# Patient Record
Sex: Male | Born: 1950 | Race: White | Hispanic: No | Smoking: Former smoker
Health system: Southern US, Community
[De-identification: ages and names within clinical notes are randomized; demographics above are authoritative.]

## PROBLEM LIST (undated history)

## (undated) DIAGNOSIS — J189 Pneumonia, unspecified organism: Secondary | ICD-10-CM

## (undated) DIAGNOSIS — K219 Gastro-esophageal reflux disease without esophagitis: Secondary | ICD-10-CM

## (undated) DIAGNOSIS — R011 Cardiac murmur, unspecified: Secondary | ICD-10-CM

## (undated) DIAGNOSIS — G4730 Sleep apnea, unspecified: Secondary | ICD-10-CM

## (undated) DIAGNOSIS — J479 Bronchiectasis, uncomplicated: Secondary | ICD-10-CM

## (undated) DIAGNOSIS — J45909 Unspecified asthma, uncomplicated: Secondary | ICD-10-CM

## (undated) DIAGNOSIS — E119 Type 2 diabetes mellitus without complications: Secondary | ICD-10-CM

## (undated) HISTORY — DX: Sleep apnea, unspecified: G47.30

## (undated) HISTORY — DX: Cardiac murmur, unspecified: R01.1

## (undated) HISTORY — DX: Bronchiectasis, uncomplicated: J47.9

## (undated) HISTORY — DX: Type 2 diabetes mellitus without complications: E11.9

## (undated) HISTORY — DX: Unspecified asthma, uncomplicated: J45.909

## (undated) HISTORY — DX: Pneumonia, unspecified organism: J18.9

## (undated) HISTORY — PX: ELBOW SURGERY: SHX618

## (undated) HISTORY — DX: Gastro-esophageal reflux disease without esophagitis: K21.9

---

## 1999-07-26 ENCOUNTER — Ambulatory Visit (HOSPITAL_BASED_OUTPATIENT_CLINIC_OR_DEPARTMENT_OTHER): Admission: RE | Admit: 1999-07-26 | Discharge: 1999-07-26 | Payer: Self-pay | Admitting: Orthopedic Surgery

## 1999-08-09 ENCOUNTER — Ambulatory Visit (HOSPITAL_BASED_OUTPATIENT_CLINIC_OR_DEPARTMENT_OTHER): Admission: RE | Admit: 1999-08-09 | Discharge: 1999-08-09 | Payer: Self-pay | Admitting: Orthopedic Surgery

## 2000-10-09 HISTORY — PX: SPINAL FUSION: SHX223

## 2000-12-21 ENCOUNTER — Ambulatory Visit (HOSPITAL_BASED_OUTPATIENT_CLINIC_OR_DEPARTMENT_OTHER): Admission: RE | Admit: 2000-12-21 | Discharge: 2000-12-21 | Payer: Self-pay | Admitting: Orthopedic Surgery

## 2001-11-12 ENCOUNTER — Encounter: Admission: RE | Admit: 2001-11-12 | Discharge: 2001-11-12 | Payer: Self-pay | Admitting: Specialist

## 2001-11-12 ENCOUNTER — Encounter: Payer: Self-pay | Admitting: Specialist

## 2002-01-08 ENCOUNTER — Encounter: Payer: Self-pay | Admitting: Specialist

## 2002-01-08 ENCOUNTER — Inpatient Hospital Stay (HOSPITAL_COMMUNITY): Admission: RE | Admit: 2002-01-08 | Discharge: 2002-01-13 | Payer: Self-pay | Admitting: Specialist

## 2002-01-10 ENCOUNTER — Encounter: Payer: Self-pay | Admitting: Specialist

## 2002-01-11 ENCOUNTER — Encounter: Payer: Self-pay | Admitting: Specialist

## 2002-10-09 HISTORY — PX: WRIST SURGERY: SHX841

## 2003-01-29 ENCOUNTER — Encounter
Admission: RE | Admit: 2003-01-29 | Discharge: 2003-04-29 | Payer: Self-pay | Admitting: Physical Medicine & Rehabilitation

## 2003-05-28 ENCOUNTER — Encounter
Admission: RE | Admit: 2003-05-28 | Discharge: 2003-08-26 | Payer: Self-pay | Admitting: Physical Medicine & Rehabilitation

## 2003-08-27 ENCOUNTER — Encounter
Admission: RE | Admit: 2003-08-27 | Discharge: 2003-11-25 | Payer: Self-pay | Admitting: Physical Medicine & Rehabilitation

## 2003-11-03 ENCOUNTER — Emergency Department (HOSPITAL_COMMUNITY): Admission: EM | Admit: 2003-11-03 | Discharge: 2003-11-03 | Payer: Self-pay | Admitting: *Deleted

## 2004-01-07 ENCOUNTER — Encounter
Admission: RE | Admit: 2004-01-07 | Discharge: 2004-04-06 | Payer: Self-pay | Admitting: Physical Medicine & Rehabilitation

## 2004-04-07 ENCOUNTER — Encounter
Admission: RE | Admit: 2004-04-07 | Discharge: 2004-06-30 | Payer: Self-pay | Admitting: Physical Medicine & Rehabilitation

## 2004-06-30 ENCOUNTER — Encounter
Admission: RE | Admit: 2004-06-30 | Discharge: 2004-09-28 | Payer: Self-pay | Admitting: Physical Medicine & Rehabilitation

## 2004-07-01 ENCOUNTER — Ambulatory Visit: Payer: Self-pay | Admitting: Physical Medicine & Rehabilitation

## 2005-12-14 ENCOUNTER — Ambulatory Visit: Payer: Self-pay | Admitting: Physical Medicine & Rehabilitation

## 2005-12-14 ENCOUNTER — Encounter
Admission: RE | Admit: 2005-12-14 | Discharge: 2006-03-14 | Payer: Self-pay | Admitting: Physical Medicine & Rehabilitation

## 2006-02-08 ENCOUNTER — Ambulatory Visit: Payer: Self-pay | Admitting: Physical Medicine & Rehabilitation

## 2006-05-17 ENCOUNTER — Encounter
Admission: RE | Admit: 2006-05-17 | Discharge: 2006-08-15 | Payer: Self-pay | Admitting: Physical Medicine & Rehabilitation

## 2006-05-21 ENCOUNTER — Ambulatory Visit: Payer: Self-pay | Admitting: Physical Medicine & Rehabilitation

## 2006-07-19 ENCOUNTER — Ambulatory Visit: Payer: Self-pay | Admitting: Physical Medicine & Rehabilitation

## 2006-09-07 ENCOUNTER — Ambulatory Visit: Payer: Self-pay | Admitting: Physical Medicine & Rehabilitation

## 2006-09-07 ENCOUNTER — Encounter
Admission: RE | Admit: 2006-09-07 | Discharge: 2006-12-06 | Payer: Self-pay | Admitting: Physical Medicine & Rehabilitation

## 2006-11-26 ENCOUNTER — Encounter
Admission: RE | Admit: 2006-11-26 | Discharge: 2007-02-24 | Payer: Self-pay | Admitting: Physical Medicine & Rehabilitation

## 2006-11-27 ENCOUNTER — Ambulatory Visit: Payer: Self-pay | Admitting: Physical Medicine & Rehabilitation

## 2007-02-14 ENCOUNTER — Ambulatory Visit: Payer: Self-pay | Admitting: Physical Medicine & Rehabilitation

## 2007-04-04 ENCOUNTER — Encounter
Admission: RE | Admit: 2007-04-04 | Discharge: 2007-07-03 | Payer: Self-pay | Admitting: Physical Medicine & Rehabilitation

## 2007-04-09 ENCOUNTER — Ambulatory Visit: Payer: Self-pay | Admitting: Physical Medicine & Rehabilitation

## 2007-05-27 ENCOUNTER — Ambulatory Visit: Payer: Self-pay | Admitting: Physical Medicine & Rehabilitation

## 2007-08-15 ENCOUNTER — Ambulatory Visit: Payer: Self-pay | Admitting: Physical Medicine & Rehabilitation

## 2007-08-15 ENCOUNTER — Encounter
Admission: RE | Admit: 2007-08-15 | Discharge: 2007-11-13 | Payer: Self-pay | Admitting: Physical Medicine & Rehabilitation

## 2007-09-30 ENCOUNTER — Ambulatory Visit: Payer: Self-pay | Admitting: Physical Medicine & Rehabilitation

## 2007-10-21 ENCOUNTER — Emergency Department (HOSPITAL_COMMUNITY): Admission: EM | Admit: 2007-10-21 | Discharge: 2007-10-21 | Payer: Self-pay | Admitting: Family Medicine

## 2007-10-28 ENCOUNTER — Encounter
Admission: RE | Admit: 2007-10-28 | Discharge: 2008-01-26 | Payer: Self-pay | Admitting: Physical Medicine & Rehabilitation

## 2007-11-03 ENCOUNTER — Encounter
Admission: RE | Admit: 2007-11-03 | Discharge: 2007-11-03 | Payer: Self-pay | Admitting: Physical Medicine & Rehabilitation

## 2007-11-11 ENCOUNTER — Ambulatory Visit: Payer: Self-pay | Admitting: Physical Medicine & Rehabilitation

## 2007-11-26 ENCOUNTER — Emergency Department (HOSPITAL_COMMUNITY): Admission: EM | Admit: 2007-11-26 | Discharge: 2007-11-26 | Payer: Self-pay | Admitting: Family Medicine

## 2007-12-18 ENCOUNTER — Encounter
Admission: RE | Admit: 2007-12-18 | Discharge: 2007-12-19 | Payer: Self-pay | Admitting: Physical Medicine & Rehabilitation

## 2007-12-18 ENCOUNTER — Ambulatory Visit: Payer: Self-pay | Admitting: Physical Medicine & Rehabilitation

## 2011-02-21 NOTE — Procedures (Signed)
NAME:  Jonathon Cooper, Jonathon Cooper NO.:  [id number]   MEDICAL RECORD NO.:  [id number]           PATIENT TYPE:   LOCATION:                                 FACILITY:   PHYSICIAN:  [name], M.D.DATE OF BIRTH:  06-04-1951   DATE OF PROCEDURE:  DATE OF DISCHARGE:                               OPERATIVE REPORT   PROCEDURE:  This is right L3-4 transforaminal lumbar epidural steroid  injection under fluoroscopic guidance.   INDICATIONS:  Lumbar radiculopathy with MRI evidence of L3-4 foraminal  disk spacing exiting right L3 nerve root.   Pain is only partially responsive to medication management including  narcotic analgesics and interferes with daily activities as well as  mobility.   Informed consent was obtained after describing risks and benefits of the  procedure to the patient.  These include bleeding, bruising, infection,  loss of bowel and bladder function, temporary or permanent paralysis.  He elects to proceed and has given written consent.   The patient was placed prone on fluoroscopy table.  Betadine prep,  sterile drape.   A  25-gauge inch and half needle used to incise skin and subcutaneous  tissue  and 1% lidocaine x2 mL at a 3 to 4 week repeat.  Then a 22-  gauge,  1/2-inch spinal needle was inserted under fluoroscopic guidance.  L3-4 intervertebral foramen AP lateral and oblique imaging utilized.  Omnipaque 180 x 1 mL demonstrated good epidural spread followed by  injection of 1 mL of 10 mg/mL dexamethasone and 2 mL of 1% MPF  lidocaine.  The patient tolerated the procedure well.  Pre and post  injection, vital stable.  If the above is not particularly helpful in  terms of reducing pain, may need to try translaminar approach.      [name], M.D.  Electronically Signed     AEK/MEDQ  D:  11/25/2007 11:22:09  T:  11/26/2007 11:02:07  Job:  [id number]

## 2011-02-21 NOTE — Procedures (Signed)
NAME:  ELGIN, CARN NO.:  [id number]   MEDICAL RECORD NO.:  [id number]          PATIENT TYPE:  REC   LOCATION:  [facility]                          FACILITY:  [facility]   PHYSICIAN:  [name], M.D.DATE OF BIRTH:  1951/08/04   DATE OF PROCEDURE:  04/30/2007  DATE OF DISCHARGE:                               OPERATIVE REPORT   PROCEDURE:  Trigger point injection for thoracolumbar myofascial pain.   The painful area was marked and prepped 3 cm lateral to the spinous  processes of T3, T5, T7, marked with Betadine and entered with a 25-  gauge inch and a half needle angled medially.  Lidocaine 1% 0.75 mL  injected into each side after negative drawback for blood.  The patient  tolerated the procedure well.  Post injection instructions given.      [name], M.D.  Electronically Signed     AEK/MEDQ  D:  04/30/2007 09:25:51  T:  04/30/2007 84:69:62  Job:  [id number]

## 2011-02-21 NOTE — Assessment & Plan Note (Signed)
DATE OF LAST VISIT:  DATE TODAY 04/09/07  Feb 14, 2007.   HISTORY:  This is a 60 year old male whom I have been following for  lumbar laminectomy syndrome.  He has had some problems with increasing  pain above the level of his surgery. However, he has also had pain in  the mid thoracic level around T4-T7.  Which has responded to trigger  point injections.  Since I last saw him on Feb 14, 2007; he has had an  exacerbation of his pain due to two events.  Two and one-half weeks ago  he fell down some steps, at his home.  He was going down some carpeted  steps with socks; and bumped down the last several steps on his  buttocks.  In addition, one week ago he was riding his riding mower when  he hit a large hole which stopped the mower, and threw him up in the  air; and, once again, he landed hard on his buttocks.  He denies any  bowel or bladder dysfunction.  He denies any lower extremity weakness.  He has no numbness, other than his usual left foot numbness that he has  had chronically since his surgery.  He has calf pain bilaterally since  the falls.   He has an appointment with Dr. Jene Every, next week, at [facility].  That was his surgeon for his laminectomy.   REGULAR MEDICATIONS INCLUDE:  1. Tramadol 2 tabs p.o. q.i.d.  2. Ativan 0.5 nightly.  3. Trazodone 50 mg nightly.  4. Cyclobenzaprine 5 to 10 mg t.i.d. p.r.n.   His pain level, currently, is 8/10.  Sleep is fair.  He can walk 60  minutes and climbs steps.  He continues to work 40 hours a week.  He has  had some anxiety as well as increased pain. His blood pressure and pulse  have been running higher which he attributes to his pain.   SOCIAL HISTORY:  Married.   PHYSICAL EXAMINATION:  VITAL SIGNS:  Blood pressure 155/90, pulse 108,  respiratory rate 20.  He is saturating 96% on room air.  GENERAL:  A well-developed well-nourished male in mild distress.  MUSCULOSKELETAL:  He moves rather stiffly when arising  from a chair or  the exam table.  He is able to walk without evidence of toe drag or knee  instability.  His lower extremity strength is normal.  His deep tendon  reflexes are normal.  His straight leg raising causes pain in the  hamstring on the left and pain in the right calf.  There is no evidence  of muscle atrophy.  BACK:  He has tenderness in the mid thoracic area, the paraspinals, but  not at the spinous processes.  Also he has some lower pain around the  surgical incision.   IMPRESSION:  Exacerbation of low back pain.  This appears to be mainly  axial with some lower extremity radiation; however, no signs of a  significant radiculopathy or cauda equina type syndrome.   RECOMMENDATIONS:  Given the acute nature of his pain, we will supplement  his other pain medicines with hydrocodone 5/500 one p.o. t.i.d. given  #30 (a 10-day supply).  That should cover him until he gets in with Dr.  Shelle Iron for x-rays and further evaluation.  I will see him back in about  two weeks.  I do think a component of his pain is also related to his  myofascial pain in his thoracic area; and I  will schedule him for  trigger points.      [name], M.D.  Electronically Signed     AEK/MedQ  D:  04/09/2007 13:22:55  T:  04/09/2007 16:33:37  Job #:  [id number]   cc:   Jene Every, M.D.  Fax: [phone]

## 2011-02-21 NOTE — Assessment & Plan Note (Signed)
The patient returns today.  He was last seen by me for a sacroiliac  injection on September 09, 2007.  He had done quite well with this and in  fact the pain in the lower back and buttock area has improved.  His main  complaint is his mid back pain.  He has had no new medical problems in  the interval of time.  He can walk 60 minutes at a time.  He continues  to do some yard work such as Roto-tilling.  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   PHYSICAL EXAMINATION:  GENERAL:  No acute distress.  Mood and affect  appropriate.  VITAL SIGNS:  Blood pressure 169/81, pulse 89, respiratory rate 20, O2  saturation 96% on room air.  BACK:  Tenderness intrascapular area right over the spinous process  around T5 area.  EXTREMITIES:  He has good lower extremity strength.  His low back has no  pain over the PSIS area.  His hip range of motion is good.  Deep tendon  reflexes are normal.  Upper extremity strength is normal.  Upper  extremity range of motion is normal.   IMPRESSION:  1. Sacroiliac pain status post L4-5 fusion has had some improvement      with injection.  2. Chronic thoracic pain.  He has had myofascial pain in that area,      although, today appears to be more axial.   PLAN:  Continue current medications which is hydrocodone 5/500 b.i.d. as  well as Tramadol two p.o. q.i.d., Ativan 0.5 q.h.s., Flexeril 10 one  half to one p.o. t.i.d., and Lidoderm patch up to three patches per day  as well as Trazodone 50 mg q.h.s.   I will see him back in one month.      [name], M.D.  Electronically Signed     AEK/MedQ  D:  09/30/2007 15:47:16  T:  09/30/2007 22:13:03  Job #:  [id number]

## 2011-02-21 NOTE — Assessment & Plan Note (Signed)
[name] returns today. He was last seen by me on 06/21/2007 at which  time we did a follow up visit. He has a history of L4-5 fusion with  lumbar post-laminectomy syndrome. He had a left sacral ileac arthropathy  improved after sacral ileac injection x2 which was done on the left side  back in August and also in July. He had good relief with that, however,  he has had some recurrence now over the last several weeks.   No new back injuries. His pain in the mid-back area is fairly well  controlled on the current medications.   MEDICATIONS:  1. Tramadol 2 q.i.d.  2. Flexeril 10 mg t.i.d.   He had been on hydrocodone 5/500 mg 1 daily. These upset his stomach  somewhat, but they did help. He did not take the Tramadol when he took  the hydrocodone.   PHYSICAL EXAMINATION:  He has some numbness in the L5 distribution on  the left side. He has PSIS tenderness on the right, greater than the  left side. His lumbar range of motion is approximately 50% forward  flexion and extension. His lower extremity range of motion is normal at  the hips, knees, and ankles. Deep tendon reflexes are normal.   He has no tenderness to palpation over the thoracic paraspinals.   IMPRESSION:  1. 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  2. Thoracal lumbar myofascial pain that is controlled.   PLAN:  1. Continue Tramadol 50 mg 2 p.o. q.i.d.  2. Continue Sinemet 25/100 mg nightly.  3. Discontinue cyclobenzaprine 1 p.o. t.i.d.  4. Reinstitute hydrocodone 5/500 mg, but give 1/2 tablet p.o. b.i.d.      p.r.n.   I will see him back for bilateral sacral ileac injections given his  increased pain in that area, as well as some pain going down the  posterior thigh, which does not appear to be radicular, but related to  radiating pain from the sacral ileac joints.      [name], M.D.  Electronically Signed     AEK/MedQ  D:  08/16/2007 13:58:03  T:   08/17/2007 01:32:12  Job #:  [id number]

## 2011-02-21 NOTE — Procedures (Signed)
NAME:  Jonathon Cooper, Jonathon Cooper NO.:  [id number]   MEDICAL RECORD NO.:  [id number]          PATIENT TYPE:  REC   LOCATION:  [facility]                          FACILITY:  [facility]   PHYSICIAN:  [name], M.D.DATE OF BIRTH:  10-02-1951   DATE OF PROCEDURE:  12/19/2007  DATE OF DISCHARGE:                               OPERATIVE REPORT   Right L3-4 translaminar lumbar epidural steroid injection under  fluoroscopic guidance.   INDICATIONS:  Lumbar radiculitis right lower extremity.  He has had only  partial and temporary relief with medication management and with L3-4  transforaminal approach.   Pain is rated at 6/10 but averages 8 and interferes with standing and  bending and sitting.   Informed consent was obtained after describing risks and benefits of the  procedure with the patient.  These include bleeding, bruising,  infection, loss of bowel/bladder function, temporary or permanent  paralysis, and he elects to proceed and has given written consent.  The  patient placed prone on fluoroscopy table.  Betadine prep, sterile  drape, 25-gauge 1-1/2 inch needle was used to anesthetize skin and subcu  tissue 1% lidocaine x2 mL.  Then the 18 gauge 3-1/2 inch Hustead needle  was inserted under fluoroscopic guidance, L3-4 translaminar space,  targeting the inferior lamina of L3, bone contact made and needle  redirected inferiorly, medially and under lateral imaging, loss of  resistance technique was employed.  After positive loss of resistance,  Omnipaque 180 x 1 mL demonstrated good epidural spread followed by  injection 2 mL of 40 mg/mL Depo-Medrol and 2 mL of 1% preservative-free  lidocaine.  The patient tolerated the procedure well.  Pre and post  injection vitals stable.  Postinjection instructions given.   Return in 1 month, possible reinjection versus medial branches.      [name], M.D.  Electronically Signed     AEK/MEDQ  D:  12/19/2007 11:24:38   T:  12/20/2007 12:12:53  Job:  [id number]

## 2011-02-21 NOTE — Assessment & Plan Note (Signed)
The patient returns today after I last saw him December 21, 2006 at which  time I did trigger point injections.  He has been doing okay generally  speaking.  He had a couple bad days last week, mainly interscapular pain  and low back pain.  Pain interferes with activity 5 to 6 out of 10  level.  Sleep is good.  Relief from meds is fair.  The patient can walk  60 minutes at a time and climb steps.  He drives.  He is employed 40  hours a week.   His review of systems is otherwise negative.   His interval medical history is otherwise negative.  He lives with his  fiance.  He works full-time.   His blood pressure is 141/82, pulse 74, respiratory rate 16, O2 sat 95%  on room air.  Well developed, well nourished male in no acute distress.  Orientation x3.  Affect is alert.  Gait is normal.   His back has tenderness between T4 and T7 as well as L4 to S1 in the  paraspinals as well as over the spinous processes.  His lower extremity  strength is good and normal.  His deep tendon reflexes are normal and  his lower extremity range of motion is normal.   IMPRESSION:  1. Thoracolumbar myofascial pain.  2. Parascapular myofascial pain.  3. Lumbar post laminectomy syndrome.  He may be developing some facet      arthropathy above the level of his surgery.   We will apply Lidoderm patch to the paraspinal muscles and he will  schedule trigger point injection if his pain continues.      [name], M.D.  Electronically Signed     AEK/MedQ  D:  02/14/2007 14:26:19  T:  02/14/2007 15:25:22  Job #:  [id number]

## 2011-02-21 NOTE — Procedures (Signed)
NAME:  Jonathon Cooper, Jonathon Cooper NO.:  [id number]   MEDICAL RECORD NO.:  [id number]          PATIENT TYPE:  REC   LOCATION:  [facility]                          FACILITY:  [facility]   PHYSICIAN:  [name], M.D.DATE OF BIRTH:  1951-01-21   DATE OF PROCEDURE:  05/27/2007  DATE OF DISCHARGE:                               OPERATIVE REPORT   This is a left sacroiliac joint injection under fluoroscopic guidance.   INDICATIONS:  Post lumbar fusion pain, left hip and low-back.  Had good  relief with prior sacroiliac injection performed May 03, 2007 -  approximately 2-1/2 week relief.  Pain is starting to come back now   Pain is only partially responsive to medication management.   Informed consent was obtained after describing risks and benefits of the  procedure to the patient. These include bleeding, bruising, infection as  well as lower extremity weakness.  She elects to proceed.  The patient  was placed prone on fluoroscopy table.  Betadine prep, sterile drape.  A  25-gauge, 1-1/2-inch needle was used to incise the skin and subcutaneous  tissue with 1% lidocaine x2 cc.  Then, a 25-gauge, 3-inch spinal needle  was inserted in the left SI joint under fluoroscopic guidance.  AP and  lateral imaging utilized.  Omnipaque 180 x 0.5 mL demonstrated no  intravascular uptake and good joint spread of the SI joint.  Followed by  injection of 1 mL of 2% MPF lidocaine and 0.5 mL of 40 mg per mL Depo-  Medrol.  The patient tolerated the procedure well.  Post-injection  instructions given.      [name], M.D.  Electronically Signed     AEK/MEDQ  D:  05/27/2007 14:14:25  T:  05/28/2007 07:18:34  Job:  [id number]

## 2011-02-21 NOTE — Assessment & Plan Note (Signed)
FOLLOW UP VISIT REPORT   SUBJECTIVE:  The patient returns today.  I saw him last when we did a  left sacroiliac injection under fluoroscopic guidance.  He has actually  done quite well since that injection approximately 3 weeks ago.  He  feels that the deep bone pain on the left side of his low back is gone  and he can ambulate without a limp anymore.   He has had no new medical complications in the interval time.  His  average pain is a 4 to 5 out of 10 mainly in his mid back, somewhat  around the shoulder blades and then in the hip area and occasionally in  the thighs.  He can walk 1 hour.  He can drive.  He can work 40 hours a  week.   PHYSICAL EXAMINATION:  Blood pressure 151/84; pulse 90; respirations 18;  O2 saturation 94% on room air.  GENERAL:  No acute distress.  Mood and affect appropriate.  BACK:  There is no tenderness to palpation except that the PSIS on the  left side.  He has good strength in the lower extremities with good  spine range of motion.   IMPRESSION:  1. Lumbar post laminectomy syndrome.  History of L4-5 fusion.  2. 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   PLAN:  I will see him back in about 2 months.  No trigger point  injections needed at this time.      [name], M.D.  Electronically Signed     AEK/MedQ  D:  06/21/2007 16:30:30  T:  06/22/2007 11:36:21  Job #:  [id number]

## 2011-02-21 NOTE — Assessment & Plan Note (Signed)
Jonathon Cooper returns today.  He has had some exacerbation of his low back  pain, has lumbar post laminectomy syndrome.  He has had back and buttock  pain going down further into the left thigh and just into the right  buttocks area.  No pain below the knee.  He does have some pain in the  lumbar area as well.  However, this is at around the T4 area as well.  He has seen Dr. Paula Libra at [facility] who has re imaged  his lumbar area and no evidence of hardware failure at L4-L5 or L5-S1.   MEDICATIONS:  1. Tramadol 2 p.o. q.i.d.  2. Ativan 0.5 daily.  3. Trazodone 50 mg nightly  4. Cyclobenzaprine 5 to 10 t.i.d. p.r.n.  He has been taking up to 2      cyclobenzaprines at a time and I did caution him about recommended      dosages.   SOCIAL HISTORY:  Married.  Lives with his wife and daughter, and has  recently had a problem with his air conditioning.   REVIEW OF SYSTEMS:  Problems with walking and problems concentrating due  to pain.   PHYSICAL EXAMINATION:  Blood pressure 145/85, pulse 73, respirations 17,  O2 sat 96% on room air.  GENERAL:  No acute distress, mood and affect appropriate.  Gait is normal.   IMPRESSION:  Lumbar and buttock pain.  We talked at some length in the  setting of post fusion, L4-L5, L5-S1, we talked at length about the  potential pain generators, including the L3-L4 facet level, as well as  the sacroiliac level, and given that he has more buttock pain, I think  that this may be the primary generator.  Given the gold standard for  diagnosing this would be sacroiliac injection, will proceed with an  injection of the left sacroiliac joint.  We will followup his pain  levels on that side, and if his pain switches more to the right side,  this would be strong evidence that this is a primary generator.  Other  potential pain generators include L3-L4 facet levels, and should he have  no significant relief with the sacroiliac injection, this would be  the  next pain generator to investigate.   I will see him back for the site injection.  He is not on any  antibiotics or any anticoagulants.  Will continue his current  medications, he is not due for any refills at this time.      [name], M.D.  Electronically Signed     AEK/MedQ  D:  04/30/2007 09:30:10  T:  04/30/2007 12:27:46  Job #:  [id number]

## 2011-02-21 NOTE — Procedures (Signed)
NAME:  Jonathon Cooper, Jonathon Cooper NO.:  [id number]   MEDICAL RECORD NO.:  [id number]          PATIENT TYPE:  REC   LOCATION:  [facility]                          FACILITY:  [facility]   PHYSICIAN:  [name], M.D.DATE OF BIRTH:  [birth year]   DATE OF PROCEDURE:  05/03/2007  DATE OF DISCHARGE:                               OPERATIVE REPORT   PROCEDURE PERFORMED:  Left sacroiliac injection under fluoroscopic  guidance.   INDICATION:  Post fusion sacroiliac disorder.  The pain is only  partially responsive to narcotic analgesic medications.   Informed consent was obtained after describing risks and benefits of the  procedure to the patient, these included bleeding, bruising, infection,  loss of bowel and bladder function, he elects to proceed and has given  written consent. The patient was placed prone on the fluoroscopy table,  Betadine prep, sterile drape.  A 25 gauge 1 1/2 inch needle was used to  anesthetize the skin and subcu tissue with 1% lidocaine x2 mL and a 25  gauge 3 inch spinal needle was inserted under fluoroscopic guidance in  the left SI joint.  AP and lateral imaging utilized.  Omnipaque 180 x  0.5 mL demonstrated no intravascular uptake and good joint outline.  Then, a solution containing 0.5 mL of 40 mg per mL Depo-Medrol and 1 mL  of 2% lidocaine were injected.  The patient tolerated the procedure  well.  Pre and post injection vitals stable.      [name], M.D.  Electronically Signed     AEK/MEDQ  D:  05/03/2007 17:13:17  T:  05/04/2007 15:21:48  Job:  [id number]

## 2011-02-21 NOTE — Assessment & Plan Note (Signed)
This is an office visit for back pain and right greater than left lower  extremity pain.   Patient follows up today.  He last saw me October 28, 2007.  He has  developed some burning pain in right greater than left thigh and there  is some pain in the left lateral calf.  The pain is around 5/10 despite  hydrocodone.  When I saw him last visit, he wanted to try something  stronger than hydrocodone and we checked a urine drug screen and it did  show hydrocodone, no other nonprescribed opiates but did demonstrate  positive THC.   His pain has not changed in the last couple weeks.  His sleep is fair.  He can walk 60 minutes at a time.  He is not working.  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  He is still doing some  studio work Geophysicist/field seismologist.   REVIEW OF SYSTEMS:  Negative for bowel/bladder dysfunction.   CURRENT MEDS:  1. Hydrocodone 5/500 b.i.d.  2. Flexeril 10 mg 1/2 p.o. t.i.d.  3. Ativan 0.5 q.h.s.  4. Tramadol 2 p.o. q.i.d.   SOCIAL:  Married, lives with his wife and child.  Noted above, has not  worked since August.   EXAMINATION:  Blood pressure 151/91.  Pulse 95.  Respirations 18.  O2  sat 96% on room air.  GENERAL:  No acute distress.  Mood and affect appropriate.  Gait is  normal.  Sensation, decreased right L3, decreased right L4, decreased  bilateral L5 and right S1 dermatome.  Deep tendon reflexes are normal,  bilateral upper and lower extremity.  Range of motion is normal,  bilateral upper and lower extremity.  Spine range of motion, 50% forward  flexion/extension, lateral rotation, and rotation.  His gait is normal.  No evidence of toe drag or knee instability.  He is able to toe walk and  heel walk.  His lower extremities have no evidence of edema.  He has  normal coordination.   I reviewed his MRI results with him.  He had an MRI of the lumbar spine  with and without contrast performed November 03, 2007.  He had a right  foraminal disk  protrusion mildly displacing exiting right L3 nerve root.  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  His operative levels L4-S1 are well decompressed.  No  problems at these levels.   I discussed this with the patient with the aid of spine model.   Further clinical testing bilateral negative femoral stretch test.   IMPRESSION:  Lumbar post laminectomy syndrome with increasing lower  extremity pain.  His buttocks and hip pain have improved after  sacroiliac injections.  Given potential for L3 nerve root involvement  seen on imaging and some correlation with decreased sensation, as well  as pain in the thigh on the right side, we will do epidural steroid  injection, right L3-4 transforaminal route.  Should this not be of  particular benefit, would consider addressing lumbar facets above level  of fusion.   In regard to lipomatosis, likely this is not anything new and less  inclined to think that this is a cause of significant radicular  discomfort.   In regard to positive THC, we will need to do retesting on his urine and  in the meantime advised him to start on ibuprofen 800 mg t.i.d. along  with Prilosec 20 mg a day until I see him for  his epidural injection.      [name], M.D.  Electronically Signed     AEK/MedQ  D:  11/12/2007 14:56:53  T:  11/13/2007 09:33:32  Job #:  [id number]   cc:   Jene Every, M.D.  Fax: [phone]   Theressa Millard, M.D.  Fax: [phone]

## 2011-02-21 NOTE — Procedures (Signed)
NAME:  Jonathon Cooper, Jonathon Cooper NO.:  [id number]   MEDICAL RECORD NO.:  [id number]          PATIENT TYPE:  REC   LOCATION:  [facility]                          FACILITY:  [facility]   PHYSICIAN:  [name], M.D.DATE OF BIRTH:  [birth year]   DATE OF PROCEDURE:  09/09/2007  DATE OF DISCHARGE:                               OPERATIVE REPORT   PROCEDURE:  Sacroiliac injection, bilateral.   INDICATIONS FOR PROCEDURE:  Sacroiliac pain, status post L4-5 fusion,  buttock pain only partially relieved with medication management  including narcotic analgesic medications.   Informed consent was obtained after describing risks and benefits of the  procedure to the patient.  These include bleeding, bruising, infection,  loss of bowel and bladder function,  temporary or permanent paralysis.  The patient elects to proceed and has given written consent.   The patient placed prone on the fluoroscopy table.  Betadine prep,  sterile drape, a 25-gauge inch and needle was used to anesthetize skin  and subcu tissue, 1% lidocaine x2 mL and a 25-gauge three inch spinal  needle was inserted first in the right SI joint under AP, lateral and  oblique imaging.  Omnipaque 180 x0.5 mL demonstrated good joint outline  followed by injection of 0.5 mL of 40 mg/mL Depo-Medrol and 1 mL of 2%  MPF lidocaine.  The same procedure was repeated on the left side using  the same equipment, same technique and injectate.  The patient tolerated  the procedure well.  Return in three weeks for possible reinjection.      [name], M.D.  Electronically Signed     AEK/MEDQ  D:  09/09/2007 13:47:46  T:  09/09/2007 15:08:20  Job:  [id number]

## 2011-02-21 NOTE — Assessment & Plan Note (Signed)
The patient had relief from sacroiliac injection done September 1.  When  I saw him back December 22, it was still working well, but it has worn  off.  In fact, he is a bit discouraged at this point.  Has some burning  pain in the left lateral calf and some aching pain in the bilateral  thighs, as well as bilateral buttock pain.  The pain is about 5/10,  sometimes 5-1/2, averaging despite taking some hydrocodone.  His sleep  is poor to fair.  His pain is rather persistent during the day, and it  does wake him up at night.  Relief of meds is fair.  He can walk 60  minutes at a time.  He climbs steps.  He drives.  He has not been  employed since May 30, 2007.  Otherwise, from a social standpoint, he  has continued to look for a job.  He thinks his savings will last  another couple of months.   EXAMINATION:  Blood pressure 182/90, pulse 110, respirations 18, O2  saturation 95% on room air.  Alert and oriented x3.  Affect is alert.  Gait is normal.  His back has some tenderness in the lumbosacral junction as well as just  above the level of his L4-5 level.   IMPRESSION:  Lumbar postlaminectomy syndrome.  He has L4-5, L5-S1  fusion.  He has evidence of sacroiliac pain responsive to sacroiliac  injection with temporary result.  We did discuss the possibility of  radiofrequency neurotomy, which may give him longer-lasting results.  We  would like to try somewhat longer-lasting pain medicine, or something a  little stronger than the hydrocodone.  We did discuss that, pros and  cons of this, and the need for repeat urine drug screen prior to  embarking upon this.   PLAN:  Continue his tramadol 2 p.o. q.i.d., Ativan 0.5 nightly, Flexeril  1/2 p.o. t.i.d., and trazodone 50 nightly, as well as the hydrocodone  5/500 b.i.d.  His options include increasing hydrocodone dose and  switching to oxycodone or a long-acting morphine versus a low-dose  Fentanyl patch.  In addition, he may benefit from  additional physical  therapy.  In terms of further evaluating his increase in pain levels, we  will check a lumbar MRI with and without contrast.      [name], M.D.  Electronically Signed     AEK/MedQ  D:  10/28/2007 13:42:16  T:  10/28/2007 14:25:31  Job #:  [id number]   cc:   Theressa Millard, M.D.  Fax: [phone]

## 2011-02-24 NOTE — Procedures (Signed)
NAME:  ORONDE, HALLENBECK                        ACCOUNT NO.:  [id number]   MEDICAL RECORD NO.:  [id number]                   PATIENT TYPE:  REC   LOCATION:  [facility]                                  FACILITY:  [facility]   PHYSICIAN:  [name], M.D.           DATE OF BIRTH:  04-02-1951   DATE OF PROCEDURE:  DATE OF DISCHARGE:                                 OPERATIVE REPORT   DATE OF BIRTH:  [birth year]   PROCEDURE:  Trigger point injection.  The areas lateral to the spinous  process of T7 bilaterally, T9 bilaterally, T11 bilaterally and L5  bilaterally were marked and prepped with Betadine.  Then a solution  containing 0.75 mL of 40 mg per mL of Kenalog and 4 mL of 1% lidocaine were  injected with trigger point deactivation. A 25 gauge inch quarter needle was  utilized.  Approximately 0.5 mL of solution was injected in each side after  negative draw back for blood. The patient tolerated the procedure well.  He  is to return in two months. His last trigger point injection lasted about  four months.                                                [name], M.D.    AEK/MEDQ  D:  01/11/2004 16:57:23  T:  01/11/2004 04:54:09  Job:  [id number]

## 2011-02-24 NOTE — H&P (Signed)
[facility]  Patient:    Jonathon Cooper, Jonathon Cooper Visit Number: [id number] MRN: [id number]          Service Type: Attending:  Javier Docker, M.D. Dictated by:   Dorie Rank, P.A. Adm. Date:  01/08/02   CC:         Julieanne Manson, M.D.   History and Physical  DATE OF BIRTH:  Jul 25, 1951  CHIEF COMPLAINT:  Low back pain.  HISTORY OF PRESENT ILLNESS:  The patient is a pleasant 60 year old male who has had a long history of low back pain.  It is interfering with his activities of daily living.  He had a CT/diskography which revealed degenerative disk disease at L4-L5 and L5-S1.  It was felt he would benefit from undergoing a two-level fusion.  The risks and benefits as well as the procedure were discussed with the patient, who elected to proceed.  ALLERGIES:  No known drug allergies.  MEDICATIONS: 1. Darvocet. 2. Ultram. 3. Zoloft 50 mg p.o.  PAST MEDICAL HISTORY: 1. Anxiety. 2. Hypertension, which was thought to be secondary to an allergy medication.    This resolved when he discontinued the medicine. 3. Degenerative disk disease.  SOCIAL HISTORY:  The patient is married.  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  He denies any tobacco or alcohol use.  He has two children.  He lives in a one-level home.  His wife will be available as caregiver after surgery.  PAST SURGICAL HISTORY: 1. Bilateral carpal tunnel release in 2001. 2. Surgery on his elbow in 2002.  FAMILY HISTORY:  Father deceased, age 97, history of DVT and lymphoblastic leukemia.  Mother living, age 17, history of MI.  REVIEW OF SYSTEMS:  GENERAL:  No fevers, chills, night sweats, or bleeding tendencies.  PULMONARY:  No shortness of breath, productive cough, or hemoptysis.  CARDIOVASCULAR:  No chest pain, angina, or orthopnea. NEUROLOGIC:  No seizures, headaches, or paralysis.  GASTROINTESTINAL:  No nausea, vomiting, diarrhea, constipation, or melena.  GENITOURINARY:   No hematuria, dysuria, or discharge.  PSYCHIATRIC:  Significant for depression and anxiety, as discussed above in the past medical history.  PHYSICAL EXAMINATION:  GENERAL:  Alert and oriented, well-developed, well-nourished 60 year old male who appears anxious today at this visit.  VITAL SIGNS:  Pulse 88, respirations 16, blood pressure 140/90.  HEENT:  Head atraumatic, normocephalic.  Oropharynx is clear.  NECK:  Supple.  Negative for carotid bruits bilaterally.  Negative for cervical lymphadenopathy palpated on exam.  LUNGS:  Clear to auscultation bilaterally.  No wheezes, rhonchi, or rales.  BREASTS:  Not pertinent to present illness.  HEART:  S1, S2.  Negative for murmur, rub, or gallop.  Regular rate and rhythm.  ABDOMEN:  Soft and nontender.  Positive bowel sounds.  GENITOURINARY:  Not pertinent to present illness.  EXTREMITIES:  Pain with range of motion to the back.  Straight leg raise produces back pain.  Normal gait pattern.  Positive Gowers maneuver when he comes up from a bent-over position.  Motor is 5/5 bilaterally.  Reflexes are 2+ and symmetrical.  SKIN:  Intact.  No rashes or lesions appreciated on exam.  LABORATORY DATA:  Preoperative laboratories and x-rays are pending at this time.  IMPRESSION:  Chronic disabling pain secondary to degenerative disk disease at L4-L5 and L5-S1.  PLAN:  The patient is scheduled for a posterior lumbar interbody fusion at L4-5, L5-S1 with pedicle screws and ______ cages. Dictated by:   Dorie Rank, P.A. Attending:  Javier Docker, M.D. DD:  12/31/01 TD:  01/01/02 Job: [id number]

## 2011-02-24 NOTE — Assessment & Plan Note (Signed)
MEDICAL RECORD NUMBER:  Is [id number].   DATE OF BIRTH:  [birth year].   Mr. Vital returns today, last seen by me May 31, 2004, overall doing  well.  He is doing a home exercise program which consists mainly of light  weights.  Pain score 4/10, going from 3-5.  Pain location, mainly mid back  and into the upper buttock area.  The pain improves with rest and  medications, made worse by bending and sitting.  He has been looking for a  job, not been successful, been out of work for about five months, applying  for unemployment.   SOCIAL HISTORY:  Remains married.   REVIEW OF SYSTEMS:  Negative except for as above.   PHYSICAL EXAMINATION:  VITAL SIGNS:  Blood pressure 140/79, pulse 78,  respirations 16, O2 sat 96% room air.  GENERAL:  Affect is alert, appearance normal.  NEUROLOGIC:  Motor strength is 4 bilateral lower extremities.  Deep tendon  reflexes are normal bilateral lower extremities.  BACK:  He has no tenderness to palpation along the cervical, thoracic, or  lumbar spine.  His lumbar range of motion is about 50% forward flexion, 50%  extension, 50% lateral rotation and bending.  EXTREMITIES:  He has no tenderness to palpation of bilateral lower  extremities.   IMPRESSION:  1.  Thoracic myofascial pain.  2.  Sacroiliac joint arthropathy.  3.  Lumbar post laminectomy syndrome.   PLAN:  1.  Continue Tramadol 50 t.i.d.  2.  Continue Skelaxin 800 t.i.d.  3.  Continue Ativan 0.5 p.o. every day.  4.  If he remains stable on these dosages, when I see him back in three      months we may be able to see him on a p.r.n. basis and transfer his      medications to his primary care physician if they accept.       AEK/MedQ  D:  07/01/2004 10:14:17  T:  07/01/2004 18:18:48  Job #:  [id number]

## 2011-02-24 NOTE — Assessment & Plan Note (Signed)
DATE OF BIRTH:  July 14, 1951.   MEDICAL RECORD NUMBER:  [id number].   [name] is a 60 year old male last seen by me for trigger point  injection January 11, 2004. Since that time, has had episodes where he has had  increased back spasms that take away his breath. He feels quite anxious  during these times and wonders if this is nerves. This happens up to eight  days out of the month but more typically three days out of the month. During  these days, even applying a Lidoderm patch, taking Skelaxin and tramadol  does not seem to help much.   INTERVAL HISTORY:  He did quit his job. Had some run ins with his boss. Pain  scores are averaging around 6/10, going from 4 to 7, which is usual range.  Pain exacerbating factors, waking, bending, sitting. Alleviating factors,  rest and medication.   CURRENT MEDICATIONS:  1. Tramadol 50 t.i.d.  2. Skelaxin 800 b.i.d.  3. Sinemet for restless less 10/100 one p.o. q.p.m.  4. Lidoderm patches.   ALLERGIES:  None known.   REVIEW OF SYSTEMS:  No suicidal thoughts.   PHYSICAL EXAMINATION:  Blood pressure 131/81, pulse 60, respirations 20, O2  saturation 87% on room air. Back has some tenderness to palpation mostly at  the lumbar sacral junction, more on the left than on the right side. No  significant pain over the thoracic area. He has forward flexion  approximately 75% range, extension 50% range, lateral rotation and bending  50% range. Gait is without abnormality. There is no toe drag or knee  instability. Upper extremities:  Full range of motion. Motor strength:  Normal bilateral upper and lower extremities.   IMPRESSION:  1. History of thoracic myofascial pain syndrome.  2. History of sacroiliac joint arthropathy.  3. Chronic radiculopathy, right lower extremity, status post interbody     fusion, L4-5, L5-S1, per Dr. Shelle Iron.  4. Anxiety continues. May be exacerbating above problems.  5. The patient states that a recent MRI done per Dr.  Shelle Iron has shown some     old compression fractures. These much be new since the last MR done     September 10, 2004.   PLAN:  1. Continue Tramadol 50 t.i.d.  2. Continue Skelaxin 800 b.i.d.  3. Continue Lidoderm patch, on 12, off 12.  4. Will give him a trial of Ativan. Have given him 10 tablets of 0.5 mg to     be taken during the days that he is having more back spasm symptoms.     Consider for change of his antidepressant medications.  5. I will see him back in one month.    [name], M.D.   AEK/MedQ  D:  04/26/2004 13:54:02  T:  04/26/2004 18:10:11  Job #:  [id number]

## 2011-02-24 NOTE — Assessment & Plan Note (Signed)
A 60 year old male with thoracic myofascial pain syndrome as well as  sacroiliac arthropathy and lumbar post laminectomy syndrome, left lower  extremity.   He has been maintained on the following medication regimen:  1.  Ultracet two tablets q.i.d.  2.  Ativan 0.5 q.h.s.  3.  Lidoderm patch, on 12, off 12.  4.  Trazodone 50 mg q.h.s.  5.  She is also on Sinemet 10/250 one p.o. q.h.s. for restless legs.   Had trigger point injections done last visit which helped for about a month  and have worn off.  They were done on December 15, 2005.   He has continued to work full time.  He has continued to work out with  weights but has used lower weights with higher repetitions.   PHYSICAL EXAMINATION:  GENERAL:  No acute distress.  Mood and affect  appropriate.  His pain level is a 7 out of 10.  VITAL SIGNS:  His blood pressure is 150/80, pulse 96, respirations 16, O2  sat 95% room air.  BACK:  Tenderness to palpation lumbar paraspinal muscles at L3 and L1.  Thoracic paraspinal muscles at T6 and T4.  NEUROLOGIC:  His gait is normal.  His strength is normal.   IMPRESSION:  1.  Thoracolumbar myofascial pain syndrome.  2.  Lumbar post laminectomy pain syndrome, managed well with Ultracet.   PLAN:  1.  We will do trigger point injections for the myofascial pain syndrome.  2.  Continue current meds for his post laminectomy pain syndrome.  3.  I will see him back in two to three months.   ADDENDUM:  Trigger point injection.  Informed consent was obtained after  describing risks, benefits to the patient, who elected to proceed.  A 25  gauge 1 inch and a 1/2 needle was used to inject.  Injected bilateral L3 and  L1 paraspinal muscles as well as the bilateral T6 paraspinal muscles and  right T4 paraspinal muscles.  The patient tolerated the procedure well.  I  injected 0.5 cc of 1% lidocaine into each site with trigger point  deactivation to follow.      [name], M.D.  Electronically Signed     AEK/MedQ  D:  02/09/2006 13:04:55  T:  02/10/2006 12:30:33  Job #:  [id number]   cc:   Marcene Duos, M.D.  Fax: [phone]

## 2011-02-24 NOTE — Procedures (Signed)
NAME:  Jonathon Cooper, Jonathon Cooper NO.:  [id number]   MEDICAL RECORD NO.:  [id number]          PATIENT TYPE:  REC   LOCATION:  [facility]                          FACILITY:  [facility]   PHYSICIAN:  [name], M.D.DATE OF BIRTH:  1950/10/30   DATE OF PROCEDURE:  DATE OF DISCHARGE:                                 OPERATIVE REPORT   HISTORY:  A 60 year old male with thoracic myofascial pain syndrome, as well  as sacroiliac arthropathy.  He has lumbar post-myomectomy syndrome left  lower extremity.  Last visit we switched him to Elavil which made him feel  shaky, did not help with his sleep, and, therefore, he went back to taking  his Ativan, Sinemet, Zoloft and trazodone.  We did increase his Ultracet to  2 tablets q.i.d. and this has helped him considerably with his low back pain  and, in fact, even lifting his 30 pound daughter does not bother his back if  he takes his medicine and sometimes adds some Advil with it.  He did finish  up his Celebrex for a flare up of pain and this was helpful for him.   He would like trigger point injections given as he has some thoracic  paraspinal pains that have been worse over the last couple of weeks.   Pain is rated at a 6-7/10 level, described as sharp and burning, constant,  aching along his mid back and upper back areas, more so than his legs.  His  blood pressure is 142/83, pulse 80, respirations 17, O2 sat 97% on room air.  Gait is normal.  His mood and affect are appropriate.  His orientation times  three.  He has mild to moderate truncal obesity.  His back has tenderness to  palpation bilateral T5 paraspinals, bilateral T12 paraspinals, bilateral L2  paraspinals and left L3 paraspinals.   IMPRESSION:  1.  Thoracic myofascial pain syndrome.  2.  Lumbar postlaminectomy pain syndrome.  3.  Sacroiliac arthropathy.   PLAN:  1.  We will do trigger point injections today.  2.  Continue Ultracet two q.i.d.  3.  Consider switch from  Ativan to Ambien next visit as he just had his      Ativan refilled.   ADDENDUM:  Above mentioned trigger points are marked and prepped with  Betadine, after informed consent was obtained.  A 25-gauge, 1-1/2 inch  needle was utilized, inserted to a depth of 3/4 inch angled medially  approximately 3 cm from the spinous process.  Negative draw-back for blood  and 0.5 cc of lidocaine injected into each side, fan-  like deactivation pattern.  Patient tolerated procedure well.  Post  injection instructions given.  He is to return in one month.      [name], M.D.  Electronically Signed     AEK/MEDQ  D:  01/15/2006 13:09:33  T:  01/16/2006 00:55:30  Job:  [id number]   cc:   Marcene Duos, M.D.  Fax: [phone]

## 2011-02-24 NOTE — Assessment & Plan Note (Signed)
Jonathon Cooper follows up after last seeing me July 19, 2006.  He has a  history of thoracolumbar myofascial pain syndrome, as well as a lumbar  post laminectomy pain syndrome.  He has good temporary responses to  trigger point injections, however, they only last several days.  He  tried Rozerem for sleep, but thinks that the Trazodone works better.  He  has had some improvement in relief with the switch from Ultracet to  Ultram, and on his own tried q.i.d. dosing, and this worked better than  t.i.d.  In addition, he is using Flexeril t.i.d. which is helpful for  him, in addition to the Ultram.   Activity level is good.  He is walking frequently and continues to work  40 hours a week.   EXAMINATION:  Moderate tenderness to palpation, bilateral lumbar  paraspinal, lower level.  His full flexion is about 50 percent.  Extension is to 25 percent.  His gait is without evidence of toe drag or  knee instability. He is able to toe-walk and heel-walk without  difficulty.   IMPRESSION:  1. Thoracolumbar myofascial pain syndrome.  2. Lumbar post laminectomy pain syndrome.  May be developing some      element of arthropathy.  No evidence of radiculopathy.   PLAN:  1. Given that he only has temporary relief from the trigger point      injections, I think there is a good chance that he could have long-      term relief with botulinum toxin injected in the para spinal      muscle group of the lumbar paraspinal.  Will schedule for this.  2. Continue tramadol but increase to 2 p.o. q.i.d.  3. Continue cyclobenzaprine intake one-half to 1 tablet of the 10 mg      dosage t.i.d., and trazodone 50 mg q.h.s.  4. Continue the Ativan 0.5 q.h.s.      Jonathon Cooper, M.D.  Electronically Signed     AEK/MedQ  D:  09/10/2006 09:23:25  T:  09/10/2006 14:20:38  Job #:  [id number]

## 2011-02-24 NOTE — Op Note (Signed)
[facility]  Patient:    Jonathon Cooper, Jonathon Cooper Visit Number: [id number] MRN: [id number]          Service Type: Attending:  Javier Docker, M.D. Dictated by:   Javier Docker, M.D. Proc. Date: 01/08/02                             Operative Report  PREOPERATIVE DIAGNOSIS:  Degenerative disk disease, L4-5, L5-S1.  POSTOPERATIVE DIAGNOSIS:  Degenerative disk disease, L4-5, L5-S1.  PROCEDURE: 1. Posterior lumbar and interbody fusion, L4-5, L5-S1 2. Posterolateral lumbar fusion utilizing Monarch pedicle screw    instrumentation L4-5, L5-S1, local autogenous cancellous bone and    AlloMatrix bone graft. 3. Repair of dural rent.  ANESTHESIA:  General.  SURGEON:  Javier Docker, M.D.  ASSISTANTPatricia Nettle, M.D.  BRIEF HISTORY AND INDICATION:  This is a 60 year old with incapacitating mechanical back pain for multiple years, end-stage disk degeneration of L5-S1, positive discography at L4-5 and L5-S1, negative at L3-4.  Operative intervention was indicated for fusion of degenerated joints and tubulization. Risks and benefits discussed including bleeding, infection, damage to vascular structures, CSF leakage, epidural fibrosis, chance of segment disease, pseudoarthrosis, hardware failure, etc.  TECHNIQUE:  Patient in supine position.  After the induction of adequate general anesthesia, 1 g Kefzol, he was placed prone on the ______.  All bony prominences were well-padded.  The lumbar region prepped and draped in the usual sterile fashion.  Incision was made from the spinous process of L3. Sacrum and subcutaneous tissue was dissected.  Electrocautery was utilized to achieve hemostasis.  Dorsolumbar fascia identified and divided in the line with the skin incision.  Paraspinous muscles were elevated from the lamina of L4-5 and S1.  McCullough retractor was placed.  The spinous processes of L4-L5 were skeletonized.  The spinous processes of L4-5 were  removed as well as S1. The cancellous bone graft was morcellized.  A portion of the lamina of L4 and L5 were removed as well, and neural arches retained at L4 and on the right at L5.  Ligamentum flavum was not interrupted on L4-5 or L5-S1 on the right. Partial facetectomies were performed.  Next, ligamentum flavum was detached from the caudad edge of L4 and the cephalad edge of L5 and the caudad edge of L5 and the cephalad edge of S1, retained as a mobile flap.  The thecal sac was identified at L4-5 and L5-S1, gently mobilized medially.  Disk space at L4-5 and L5 were identified.  Bipolar electrocautery was utilized to achieve strict hemostasis.  Given that we were to leave the right lateral recess intact with its overlying ligamentum flavum, we decided to pursue pedicle screw placement on the right.  Under C-arm augmentation, the transverse processes of L4-L5 and the ala were identified, as well as the pars of L4-L5.  A pedicle finder and awl was utilized to find the pedicle of L4-L5 and S1 and the anterior section of the transverse process and the superior process of L4-5 and of S1.  In the appropriate version, a pedicle finder was utilized to advance into the pedicle again under x-ray, AP and lateral plane within the pedicle at all times.  Ball tip probe placed there, and it was measured at a 40 at the pedicle of L4 and of L5 and a 35 at S1.  Placement of the pedicle at S1 was above the foramen of S1 and just lateral to  the inferior portion of the facet.  High-speed bur was utilized to decorticate the pars and the TeP at L4-5 and the ala.  Some bone graft was morcellized and placed out here in the lateral recess.  Pedicle screws of 625 Monarch system were then placed at the appropriate tapping. Excellent placement into L4-5 and S1.  Next we distracted upon the L4 and L5 screws at the distracted disk space.  The operating microscope was draped and brought onto the surgical field and placed  at annulotomy at L5-S1 on the left, protecting the neural elements at all time.  Pituitary was utilized to perform a diskectomy.  This was then sequentially dilated to 11 mm to be optimal utilizing the Merrick cage system.  This was then scraped utilizing the 11 scraper and then the box chisel at the appropriate angle and C-arm augmentation.  Thorough diskectomy performed, removal of residual endplates to bleeding bone on either side of the endplate.  Disk space on the back right was then packed with cancellous bone and AlloMatrix bone graft, and an 1111 x 25 Brantigan cage after packing with cancellous bone was then inserted from left to right towards the midline.  Excellent purchase.  This was countersunk.  In the AP and lateral plane on C-arm, we found this satisfactory.  The distraction was released off the pedicle screws of L5 and S1.  In a similar fashion, we distracted the pedicle screws at L4 and L5 and mobilized the sac medially.  Partial medial hemifacetectomy performed at L4-5, bipolar electrocautery utilizing to achieve hemostasis.  There was extensive epidural venous plexus noted.  Annulotomy was performed at L4-5.  Diskectomy was performed.  Dilated to an 11 to be optimal, and this was scraped at 11 with the scraper.  Box chisel was then advanced, neural elements well-protected.  A fair amount of epidural bleeding, and electrocautery was utilized to achieve hemostasis.  Box chisel was removed.  There was a small rent in the outer sleeve of the L5 root.  No evidence of CSF leakage.  There was some mild herniation noted.  At that time, it was felt best to close the rent; therefore, two 4-0 nylon Nurolons were utilized to close this rent. Following this, a Valsalva was performed.  There was no evidence of active CSF leakage.  Next, after a thorough diskectomy was performed, the endplates were thoroughly curetted and irrigated.  Cancellous bone as well as AlloMatrix was packed  across the center lines posteriorly on the right from the left.  An 11 x 1125 cage was then inserted obliquely towards the midline.  Excellent  purchase was obtained.  This was countersunk.  Distraction was then released off the pedicle screws.  Inspection revealed good distraction.  The hockey-stick probe placed in the foramen of L4-5 and S1 found to be widely patent.  A neural monitoring probe was then placed in each of the pedicles at L4-L5 and S1 bilaterally at 20 milliamps was noted on the neural monitoring, indicating full placement of the screws within the pedicles.  Next, wound copiously irrigated once again.  On the left laterally, the pedicle screws were then inserted at L4-5 and S1.  In a similar fashion as on the right the TePs of L4-5 and ala were decorticated.  The facet at L3-4 was preserved. This was done under x-ray with satisfactory placement, and identically length screws were placed with excellent purchase.  The cancellous bone graft was placed at the lateral recess and the TeP, L4-5  to the ala and into the pars and lateral aspect of the facet.  This was augmented with AlloMatrix bone graft.  The screws were placed.  AP and lateral indicated excellent placement of the screws.  A curved rod was contoured and placed bilaterally and secured with set screws torqued to 60 foot pound.  Excellent purchase was obtained. The wound copiously irrigated, and inspection revealed no evidence of active bleeding.  As a precautionary measure, we applied Tisseel fibrin glue around the area of the rent.  Again, Valsalva following with no evidence of CSF leakage.  To preserve the ligamentum flavum, we used that as a flap over the laminotomy defect.  AP and lateral plane was found to be satisfactory on x-ray.  Next, the retractors were removed.  Paraspinous muscles were electrocauterized.  Due to no leakage noted, the Hemovac was placed in the superficial paraspinous musculature and brought  out through a lateral stab wound in the skin.  The dorsolumbar fascia reapproximated with #1 Vicryl interrupted figure-of-eight sutures oversewn with a running locking #1 Vicryl suture.  Subcutaneous tissue reapproximated with 2-0 Vicryl simple sutures. Skin was reapproximated with staples.  Thoroughly irrigated throughout.  The ______ was utilized, 400 return, approximately a liter lost blood.  Sterile dressing was then applied, patient placed supine on the hospital bed, extubated without difficulty, and transported to the recovery room in satisfactory condition.  The patient tolerated the procedure well with no complications. Dictated by:   Javier Docker, M.D. Attending:  Javier Docker, M.D. DD:  01/08/02 TD:  01/09/02 Job: [id number]

## 2011-02-24 NOTE — Procedures (Signed)
NAME:  Jonathon Cooper, Jonathon Cooper NO.:  [id number]   MEDICAL RECORD NO.:  [id number]                   PATIENT TYPE:  REC   LOCATION:  [facility]                                  FACILITY:  [facility]   PHYSICIAN:  [name], M.D.           DATE OF BIRTH:  1950-10-19   DATE OF PROCEDURE:  09/11/2003  DATE OF DISCHARGE:                                 OPERATIVE REPORT   HISTORY OF PRESENT ILLNESS:  Mr. Marrow returns today after I last saw him  on July 17, 2003.  Had trigger point injection, lasted about two weeks,  would like repeat.  Discussed using Kenalog with the injections on this  visit.   DESCRIPTION OF PROCEDURE:  Informed consent was obtained.  Elected to  proceed.  Trigger points palpated at T7 paraspinals bilaterally and also at  T9 on the right side; also, trigger points palpated in bilateral L4 and  right L5 paraspinals.  Areas were prepped with Betadine and entered with a  25-gauge needle, 0.5 mL of solution injected at each site after negative  drawback for blood.  The patient tolerated the procedure well.   Post-injection instructions given.  Follow up in six weeks.  Call with any  complications.  Solution of injection contained 1 mL of 10 mg per mL of  Kenalog plus 2 mL of 1% lidocaine.                                                [name], M.D.    AEK/MEDQ  D:  09/11/2003 13:27:52  T:  09/11/2003 14:17:50  Job:  [id number]

## 2011-02-24 NOTE — Procedures (Signed)
NAME:  Jonathon, Cooper NO.:  [id number]   MEDICAL RECORD NO.:  [id number]          PATIENT TYPE:  REC   LOCATION:  [facility]                          FACILITY:  [facility]   PHYSICIAN:  [name], M.D.DATE OF BIRTH:  14-Aug-1951   DATE OF PROCEDURE:  05/21/2006  DATE OF DISCHARGE:                                 OPERATIVE REPORT   PROCEDURE:  Trigger point injection bilateral parascapular and bilateral  thoracolumbar.   Areas marked and prepped with Betadine, 2 cm lateral spinous process of T5  bilaterally, as well as right T6.  In addition, two areas 3 cm lateral to  the spinous process of L4 and one area at 3 cm lateral to the spinous  process at L5, marked and prepped with Betadine, entered with 25-gauge, inch  and a half needle.  Lidocaine 1% at 0.5 mL injected into each site for  trigger point injection deactivation.  Most active area was right L4  paraspinal.   Patient tolerated procedure well.   Patient also gives additional history of about two days a week having flare  ups.  We will give him samples of Celebrex for that, take 2 tablets in the  morning and 1 in the afternoon on those particular days.      [name], M.D.  Electronically Signed     AEK/MEDQ  D:  05/21/2006 12:40:39  T:  05/21/2006 21:56:45  Job:  [id number]

## 2011-02-24 NOTE — Assessment & Plan Note (Signed)
DATE OF BIRTH:  1951-01-27   MEDICAL RECORD NUMBER:  [id number]   DATE OF SERVICE:  July 28, 2004   HISTORY:  [name] returns today last seen by me on July 01, 2004.  He has a history of lumbar postlaminectomy syndrome, sacroiliac joint  arthropathy and thoracic myofascial pain syndrome.  He has been remarkably  stable on tramadol 50 p.o. t.i.d. and Skelaxin 800 t.i.d.  He takes Ativan  0.5 mg p.o. once daily at night to help him sleep.  He also takes Sinemet  10/100 one p.o. at bedtime for his restless legs.   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   REVIEW OF SYSTEMS:  Negative except for above.   ALLERGIES:  None known.   EXAMINATION:  Blood pressure 132/76, pulse 72, respiratory rate is 20, O2  saturation 98% room air.  Back there is no tenderness to palpation.  He has  75% forward flexion-extension, lateral rotation and bending, no pain in  lower extremities.  He has full strength bilateral upper and lower  extremities.  Affect is alert.  Gait is normal with normal heel walk and toe  walk.   IMPRESSION:  1.  Thoracic myofascial pain syndrome.  2.  Sacroiliac joint arthropathy.  3.  Lumbar postlaminectomy syndrome.   PLAN:  1.  Continue tramadol 50 p.o. t.i.d.  2.  Based on cost issues we will try switching from Skelaxin 800 t.i.d. to      Flexeril or cyclobenzaprine 5 mg p.o. t.i.d.  I have asked him to get      the 10 mg and break them in half.  3.  Continue Ativan 0.5 p.o. once daily.   We will contact Dr. Delrae Cooper to see if she is comfortable in managing these  particular medications for the patient's back pain.  If not we would follow  him with the assistance of our physician assistants here in our office.      Jonathon Cooper  D:  09/27/2004 13:54:11  T:  09/27/2004 18:27:46  Job #:  [id number]   cc:   Jonathon Cooper, M.D.  [address]  [place]  [place] [address]  Fax:  [phone]

## 2011-02-24 NOTE — Assessment & Plan Note (Signed)
Jonathon Cooper is here today, a follow-appointment.  He has had good response  to trigger point injection bilateral parascapular and bilateral  thoracolumbar.  He has had some job stress at work, but otherwise no new  problems.  Sleep continues to be an issue despite Trazodone at night.  He  falls asleep for several hours, wakes up in the middle of the night and  sometimes has trouble getting back to sleep.  He has had tried Elavil in the  past, but he got too groggy and had too much dry mouth from this.   CURRENT MEDICATIONS:  Include:  1. Ultracet 2 p.o. q.i.d.  He is concerned about his acetaminophen dosage.  2. Ativan 0.5 q.h.s.  3. Lidoderm patch, on 12, off 12.  4. Trazodone 50 mg q.h.s.  5. Sinemet 10/250, one p.o. q.h.s. for restless legs.   Activity level is good.  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   His exam shows no tenderness to palpation of the lumbar paraspinals or  thoracic paraspinals.  He has good forward flexion, but at end range he does  have mid-lower back pain.  His gait is without any evidence of toe drag or  knee instability.   His blood pressure is 145/78, pulse 88, respirations 16, O2 sat 94% on room  air.   IMPRESSION:  1. Thoraco-lumbar myofascial pain syndrome.  2. Lumbar post-laminectomy pain syndrome.   PLAN:  1. Continue Ativan 0.5 q.h.s.  2. Continue Lidoderm patch on 12, off 12.  3. Continue Sinemet 10/250, one p.o. q.h.s.  4. Change Ultracet to Ultram, and given change in milligram dosage, would      be able to cut back to 2 p.o. t.i.d. and escalate from there if we need      to to q.i.d.  5. Trial of Rozerem 8 mg q.h.s. in substitution for Trazodone.  I will see      him back in approximately two months.      [name], M.D.  Electronically Signed     AEK/MedQ  D:  07/19/2006 13:10:41  T:  07/21/2006 00:18:35  Job #:  [id number]   cc:   Jene Every, M.D.  Fax: [phone]   Marcene Duos, M.D.
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
## 2011-02-24 NOTE — Op Note (Signed)
. [facility]  Patient:    Jonathon Cooper, Jonathon Cooper                       MRN: [id number] Proc. Date: 12/21/00 Attending:  Katy Fitch. Naaman Plummer., M.D.                           Operative Report  PREOPERATIVE DIAGNOSIS:  Chronic painful common extensor origin tendinopathy, right elbow, unresponsive to activity modification, splinting, steroid injection and work modification.  POSTOPERATIVE DIAGNOSIS:  Chronic painful common extensor origin tendinopathy, right elbow, unresponsive to activity modification, splinting, steroid injection and work modification.  OPERATION:  Reconstruction of right common extensor origin at right elbow lateral condyle.  OPERATING SURGEON:  Katy Fitch. Sypher, Montez Hageman., M.D.  ASSISTANT:  Jonni Sanger, P.A.  ANESTHESIA:  General by LMA.  SUPERVISING ANESTHESIOLOGIST:  Burna Forts, M.D.  INDICATIONS:  Jonathon Cooper is a 60 year old man who has had chronic elbow pain for more than two years.  He has had a series of treatments including anti-inflammatory medication, splinting, activity modifications and steroid injection, all without relief.  Due to a failure to respond to nonoperative measures, he is brought to the operating room at this time for reconstruction of his common extensor origin with the primary indication of pain relief.  DESCRIPTION OF PROCEDURE:  Jonathon Cooper was brought to the operating room and placed in a supine position on the operating table.  Following induction of general anesthesia by LMA, the right arm was prepped with Betadine soaping solution and sterilely draped.  Following exsanguination of the limb with an Esmarch bandage, arterial tourniquet was inflated to 220 mmHg on the proximal brachium.  Procedure commenced with a 3-cm incision posterior to the epicondyle. Subcutaneous tissues were carefully divided, identifying the anconeus muscle. The interval between the anconeus and the common  extensor origin was incised sharply to bone.  The common origin of the extensor carpi radialis longus and brevis was elevated revealing necrotic tendon on the deep surface of the extensor carpi radialis brevis.  This was elevated to the level of the capitellum and proximally until the brachioradialis fibers were identified.  The lateral epicondyle had a very prominent lateral osteophyte due to years of tendinopathy.  The osteophyte was removed with rongeurs, followed by drilling of the epicondyle approximately 30 times with a 0.025-inch Kirschner wire, followed by decortication of the epicondyle with an osteotome.  Mattress sutures of #2 Ethibond were placed and after cleaning the deep surface of the brevis, the tendon was placed in anatomic position and sutured with mattress sutures to the epicondyle in a manner similar to a Office manager of a rotator cuff.  The anconeus was then freed up with a release of its posterior fascia and moved anteriorly approximately 1 cm to cover the suture line and to increase the blood supply to the tendon repair.  This was then sewn in position with a series of mattress sutures of 0 Vicryl, with the knots buried deep.  The wound was then thoroughly lavaged with sterile saline followed by repair of the skin with subdermal sutures of 3-0 Vicryl and intradermal 3-0 Prolene with Steri-Strips.  A compressive dressing was applied with sterile gauze, Kerlix and sterile Webril, followed by Ace wrap at the elbow and a volar plaster splint at the wrist to maintain the wrist in 40 degrees of dorsiflexion.  There were no apparent complications.  Jonathon Cooper  tolerated surgery and anesthesia well.  For aftercare, he is given a prescription for Percocet 5/325 one to two tablets p.o. q.4-6h. p.r.n. pain, also Keflex 500 mg one p.o. q.8h. x 4 days as prophylactic antibiotic.  He will return to the office for interval followup in one week or sooner p.r.n.  problems. DD:  12/21/00 TD:  12/21/00 Job: [id number]

## 2011-02-24 NOTE — Assessment & Plan Note (Signed)
DATE OF BIRTH:  04/25/1951.   MEDICAL RECORD NUMBER:  [id number].   [name] returns today, a 60 year old male, last seen by me April 26, 2004.  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 He states that his back pain and overall anxiety level is greatly  improved by taking 1 Ativan 0.5 mg tablet per day. He continues to take  Tramadol 50 mg q.i.d. as well as Skelaxin 800 t.i.d. but only occasionally  uses Lidoderm patch.   INTERVAL HISTORY:  Has taken a long motorcycle ride throughout [place]  states and has been going on extensive job search.   PHYSICAL EXAMINATION:  GENERAL:  No acute distress, mood and affect  appropriate.  BACK:  There is no tenderness to palpation. Has good range of motion in  flexion, extension, lateral rotation, and bending. Has full strength  bilateral deltoid, biceps, triceps, grips as well as hip flexion, knee  extension, ankle dorsi flexion.   IMPRESSION:  1. Thoracic myofascial pain.  2. Sacroiliac joint arthropathy.  3. Lumbar post laminectomy syndrome.   PLAN:  1. Reduce tramadol to 50 mg p.o. t.i.d.  2. Continue Skelaxin 800 t.i.d.  3. Continue Ativan 0.5 q.d.  4. Consider reduction of tramadol to b.i.d. and then q.d. tapering dosage     next time I see him. 5.  Consider reduction of Skelaxin dosage, possibly     to 400 and even discontinuation as tolerated.      [name], M.D.   AEK/MedQ  D:  05/31/2004 14:24:53  T:  06/01/2004 09:07:21  Job #:  [id number]

## 2011-02-24 NOTE — Assessment & Plan Note (Signed)
INTERVAL HISTORY:  Jonathon Cooper returns after I last saw him September 27, 2004.  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  He  had been stable on tramadol 50 t.i.d., Skelaxin 800 t.i.d., Ativan 0.5 at  bedtime and Sinemet 10/100 at bedtime for his restless legs.  In the  interval time he has had no new injuries, no new medical problems but feels  that his pain in his mid back and low back has gotten worse.  He did see  physician assistant at [facility] who started him on  hydrocodone 5/325, he takes it twice a day which is somewhat helpful.  In  addition, since I last saw him, he was started on Zoloft 100 mg daily and  trazodone 100 mg at bedtime.   He is concerned about taking so many different pills a day.   ALLERGIES:  None known.   REVIEW OF SYSTEMS:  Positive for spasms and anxiety, some weakness that he  feels generally in his legs.  He continues to workout.   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  Walks 30-60  minutes.  He is able to climb steps and drive.  He has an average pain score  of 6-7/10 and pain interference score with general activity at 7/10 level.  Relief from meds is about 45%.   EXAMINATION:  Back has tenderness to palpation along his thoracic  paraspinals starting from around T1 all the way down to L5.  His laminectomy  scar is nonpainful.  He has tenderness to palpation over bilateral gluteus  medius muscles.  He has good spine range of motion flexion-extension but has  some pain in his SI area when going from full forward flexion to upright  standing.  He has full strength bilateral lower extremities, normal deep  tendon reflexes and normal gait.   IMPRESSION:  1.  Thoracic myofascial pain syndrome.  2.  Sacroiliac arthropathy.   PLAN:  1.  In an effort to simplify his medications, we will substitute  amitriptyline 25 mg at bedtime x1 week and then go up to 50 at bedtime.      We will discontinue the cyclobenzaprine, the Zoloft, the Ativan and the      trazodone as well as the Sinemet.  2.  We will increase his tramadol to two tabs q.i.d., this is the Ultracet      formulation, and I will not refill anymore hydrocodone.  3.  We will reinstitute Lidoderm patch which he can wear on 12 and off 12.  4.  For a 2-week period of time, given if he has a flareup, I will start him      on Celebrex 200 twice daily x1 week then 200 p.o. daily x1 week.  5.  Schedule him back for trigger point injections.  He has had a series of      four in the past and may require these again until his pain under better      control.   In addition to above exam, he does have some limitation hip range of motion  with external rotation using a FABER maneuver.      [name], M.D.  Electronically Signed     AEK/MedQ  D:  12/15/2005 11:20:01  T:  12/15/2005 23:27:19  Job #:  [id number]   cc:   Marcene Duos, M.D.  Fax: [phone]

## 2011-02-24 NOTE — Assessment & Plan Note (Signed)
The patient returns today after I last saw him on September 10, 2006.  He  has thoracal lumbar myofascial pain syndrome, lumbar postlaminectomy  pain syndrome.  No significant changes in his pain distribution.  He has  had some thoracal lumbar pain.  We increased his Tramadol last visit to  q.i.d., which has been beneficial.   Activity level is good.  He works 40 hours a week.  He can walk 90  minutes at a time. +back  Spasms.   His blood pressure 144/76, pulse 80, respirations 16, O2 sating 96% on  room air.  General:  No acute distress.   His gait is normal without evidence of toe drag or knee instability.  He  is able toe walk, heel walk.  He has good strength bilateral upper and  lower extremities.   IMPRESSION:  1. Thoracal lumbar myofascial pain syndrome.  2. Lumbar postlaminectomy pain syndrome.   PLAN:  1. Continue Tramadol 2 p.o. q.i.d.  2. Continue cyclobenzaprine one half to 1 tablet t.i.d.  3. Trazodone 50 nightly.  4. Ativan 0.5 nightly.      [name], M.D.  Electronically Signed     AEK/MedQ  D:  11/27/2006 10:15:28  T:  11/27/2006 11:08:55  Job #:  [id number]

## 2011-02-24 NOTE — Procedures (Signed)
NAME:  Jonathon Cooper, Jonathon Cooper NO.:  [id number]   MEDICAL RECORD NO.:  [id number]          PATIENT TYPE:  REC   LOCATION:  [facility]                          FACILITY:  [facility]   PHYSICIAN:  [name], M.D.DATE OF BIRTH:  July 24, 1951   DATE OF PROCEDURE:  12/21/2006  DATE OF DISCHARGE:                               OPERATIVE REPORT   PROCEDURE:  Trigger point injections, bilateral thoracolumbar paraspinal  muscles adjacent to T5-L1 and L5 were marked, prepped with Betadine, and  entered with 25-gauge inch-and-a-half needle.  1 ml of 1% Lidocaine  injected into each site, all by trigger point deactivation.  The patient  tolerated the procedure well and will see me back in two months.      [name], M.D.  Electronically Signed     AEK/MEDQ  D:  12/20/2006 13:47:03  T:  12/21/2006 22:41:55  Job:  [id number]

## 2011-02-24 NOTE — Discharge Summary (Signed)
[facility]  Patient:    Jonathon Cooper, SURGEON Visit Number: [id number] MRN: [id number]          Service Type: SUR Location: 4W [id number] [id number] Attending Physician:  Pierce Crane Dictated by:   Ralene Bathe, P.A. Admit Date:  01/08/2002 Disc. Date: 01/13/02                             Discharge Summary  ADMITTING DIAGNOSES: 1. Degenerative disk disease, L4-5 and L5-S1. 2. Anxiety. 3. Remote history of hypertension.  DISCHARGE DIAGNOSES: 1. Degenerative disk disease, L4-5 and L5-S1. 2. Anxiety. 3. Remote history of hypertension. 4. Status post posterolateral interbody fusion, L4-5, L5-S1, with allograft    and local bone grafting, and dural repair.  OPERATIONS: 1. Posterolateral interbody fusion, L4-5, L5-S1, and posterolateral lumbar    fusion utilizing Monarch pedicle screw instrumentation, local autogenous    cancellous bone graft and ______ bone graft. 2. Repair of dural rent.  ANESTHESIA:  General.  SURGEON:  Jene Every, M.D.  ASSISTANT:  Sharolyn Douglas, M.D.  HISTORY OF PRESENT ILLNESS:  This is a 60 year old male with incapacitating mechanical back pain for multiple years.  He has had end-stage degenerative disk disease, L5-S1, with positive discography at L4-5 and L5-S1.  It was negative at L3-4.  Operative intervention was discussed as well as needs for fusion in degenerative joints.  Risks, benefits were discussed at length with the patient and he was in agreement and wished to proceed.  HOSPITAL COURSE:  Patient was admitted, underwent the above-named procedure and tolerated this well.  Unfortunately, intraoperatively, he did have a dural rent which was repaired.  He had no sequela after this.  He was, however, kept supine until January 10, 2002.  He did extremely well with no evidence of any dural leak postoperatively.  He was on GI precautions; however, he was very slow to progress, had a mild ileus documented with a KUB.  He was  made n.p.o. and weaned to liquids and Colace was instituted as well as Reglan and other bowel precautions.  He was slow to begin his bowel regimen; however, by date January 13, 2002, he had weaned to regular diet, had had a bowel movement, and his bowel status and KUB showed improvement of his mild colonic ileus.  He was ambulating well and doing all of his discharge goals.  He had done stairs and was doing well with a walker and able to doff and don his brace.  At this time, he was stable for discharge home.  He overall remained hemodynamically stable postoperatively and had only mild temperature spikes postoperative day #1 resolved with bedside incentive spirometry use.  All inpatient and discharge goals had been met prior to his discharge.  LABORATORY DATA:  Admission hemoglobin 14.9, postoperatively 11.3, 10.7, 10.6, and stable.  Chemistries on admission within normal limits.  On April 4, he had a sodium of 131.  Urinalysis on admission was within normal limits. Radiology shows a C arm intraoperative use for localization as well as screw fusions at L4-5, L5-S1.  EKG on admission showed normal sinus rhythm.  CONDITION ON DISCHARGE:  Stable and improved.  DISCHARGE MEDICATIONS AND PLANS:  Patient being discharged to home.  Back precautions.  Brace on when up out of bed.  Daily dressing changes.  May shower five days postoperatively.  Follow up at two weeks postoperatively. Call for time.  Prescriptions were given for Percocet one to  two every 4 to 6 p.r.n. pain, Robaxin 500 mg one every 8 p.r.n. spasm, and Colace one b.i.d. Continue bowel precautions.  Laxatives as needed.  Resume his regular diet. Resume home medications. Dictated by:   Ralene Bathe, P.A. Attending Physician:  Pierce Crane DD:  01/13/02 TD:  01/13/02 Job: [id number]

## 2012-02-14 ENCOUNTER — Other Ambulatory Visit: Payer: Self-pay | Admitting: Internal Medicine

## 2012-02-19 ENCOUNTER — Ambulatory Visit
Admission: RE | Admit: 2012-02-19 | Discharge: 2012-02-19 | Disposition: A | Discharge status: Home / Self Care | Payer: 59 | Source: Ambulatory Visit | Attending: Internal Medicine | Admitting: Internal Medicine

## 2014-04-27 IMAGING — US US ABDOMEN COMPLETE
1 series · 14 of 25 positions shown · non-contrast
Comparison: CT abdomen pelvis dated 11/03/2003

CLINICAL DATA: Elevated LFTs

COMPLETE ABDOMINAL ULTRASOUND

[Series 1: us abdomen complete · 0.37mm/px · 14 of 79 slices shown]
[im 1/79]
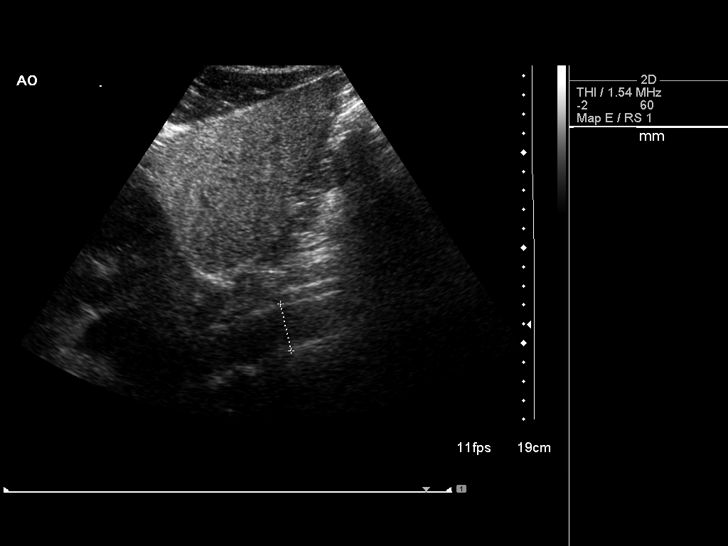
[im 7/79]
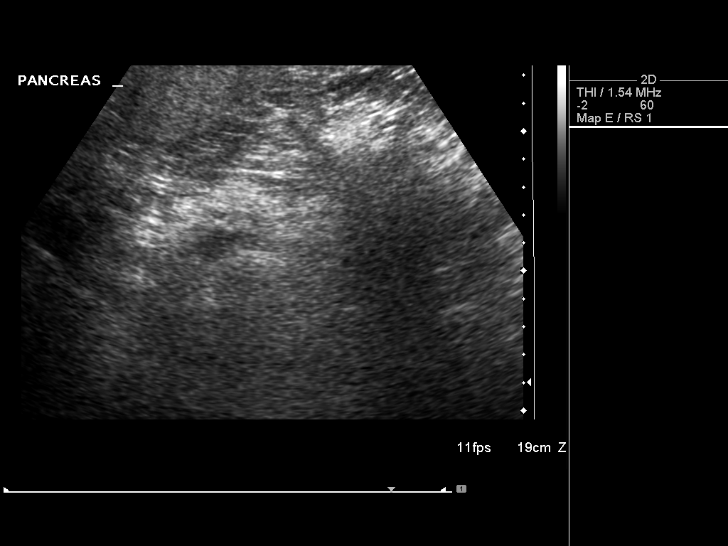
[im 14/79]
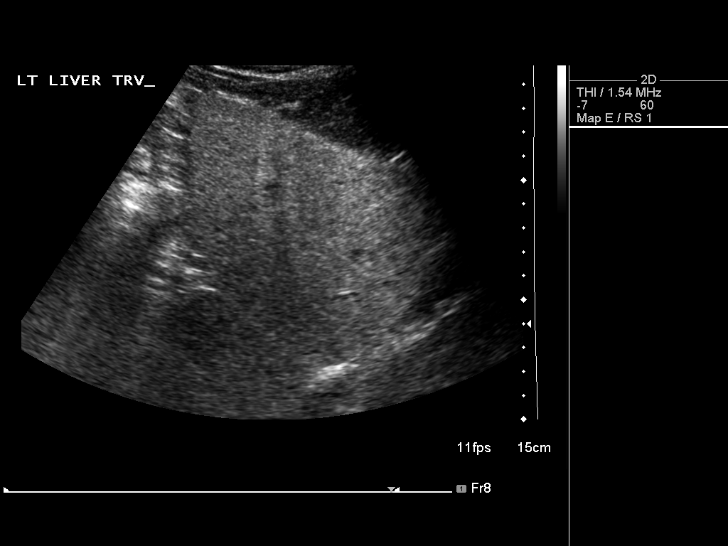
[im 20/79]
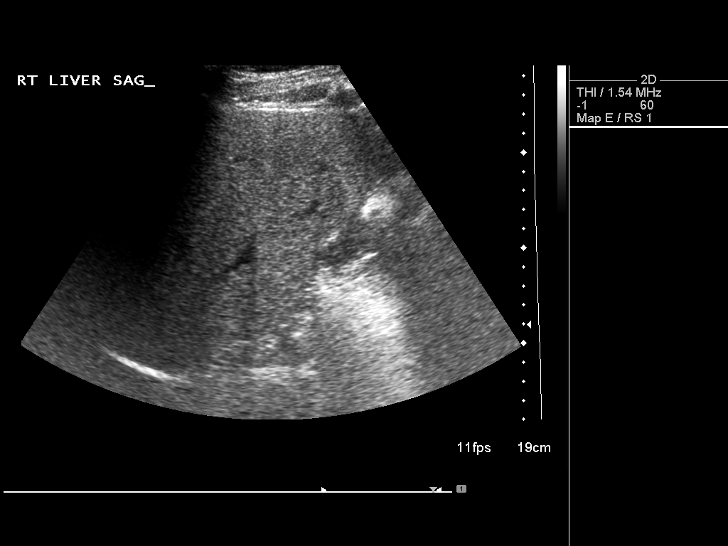
[im 27/79]
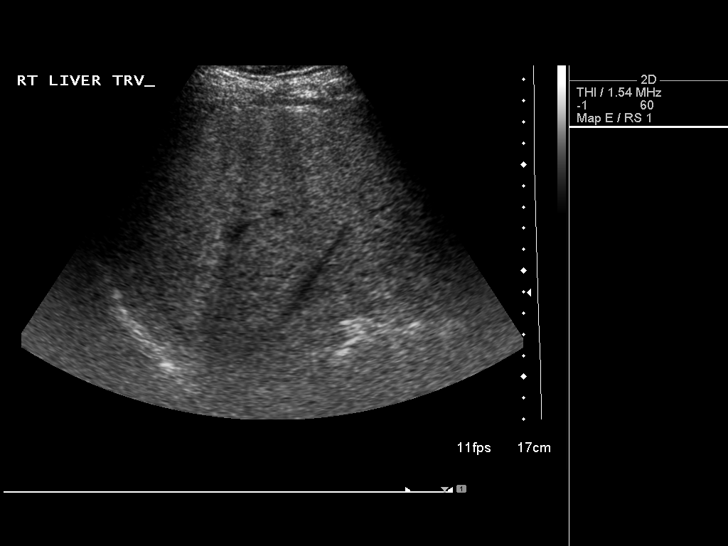
[im 30/79]
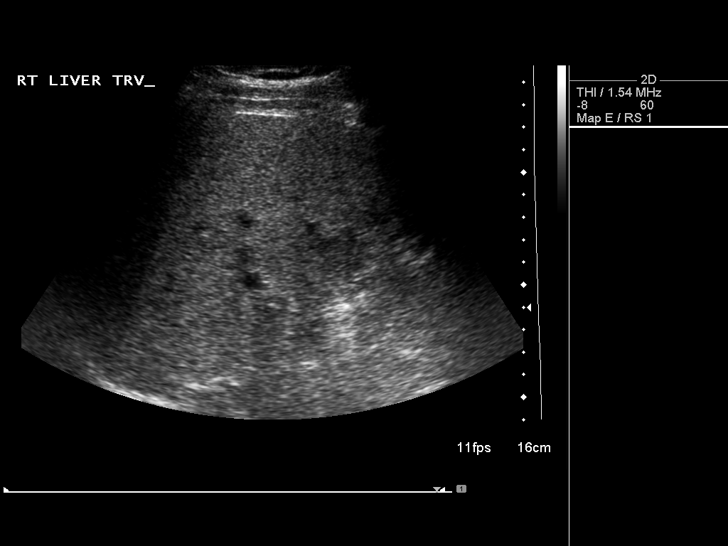
[im 36/79]
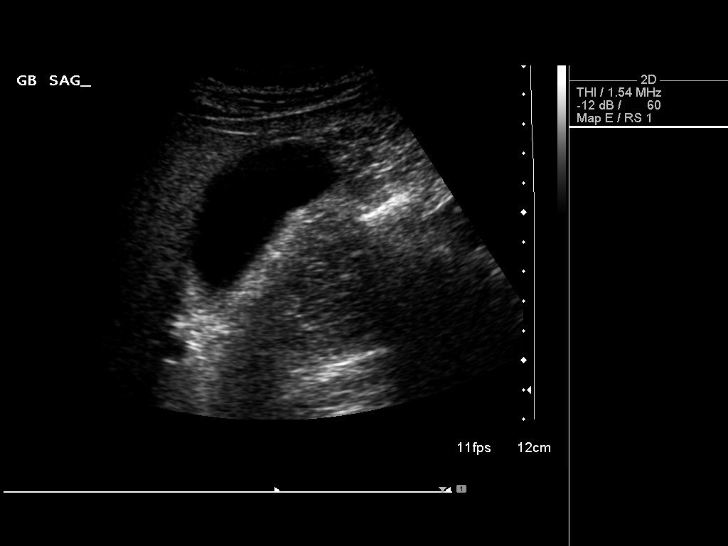
[im 43/79]
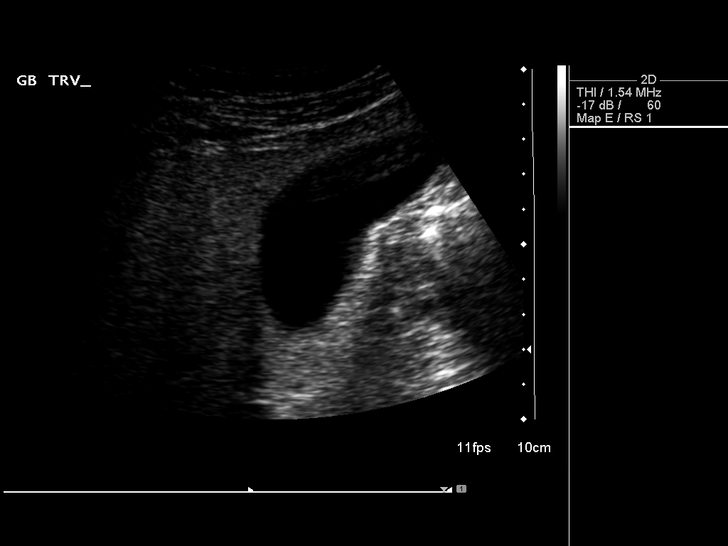
[im 49/79]
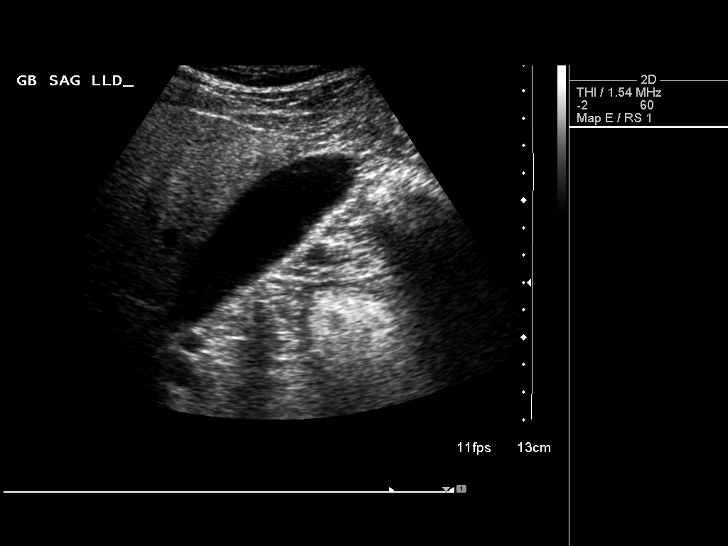
[im 53/79]
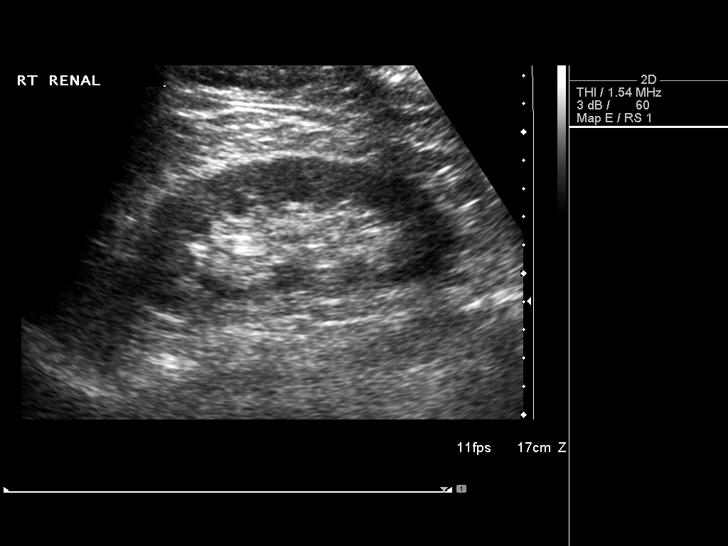
[im 59/79]
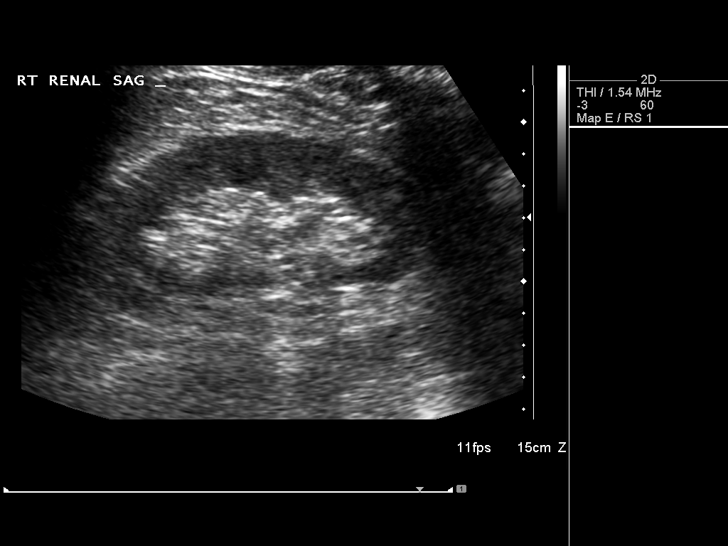
[im 66/79]
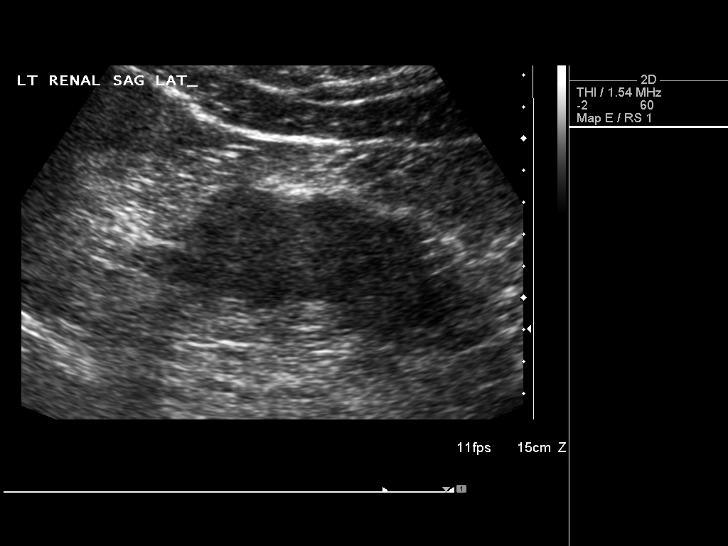
[im 72/79]
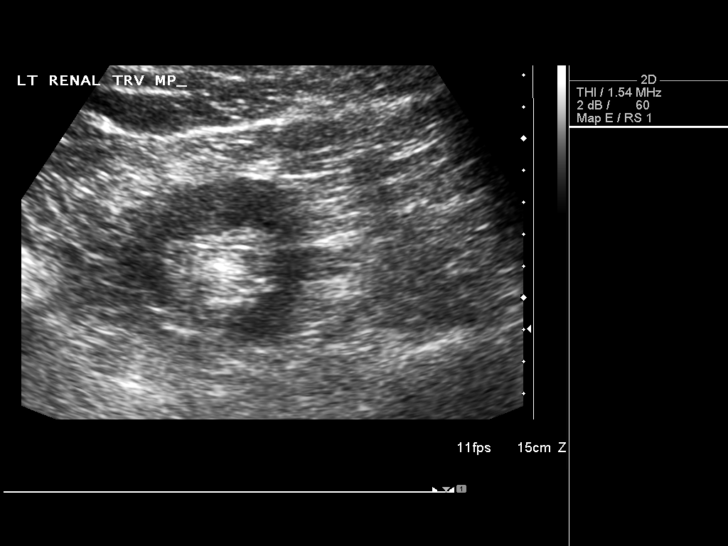
[im 79/79]
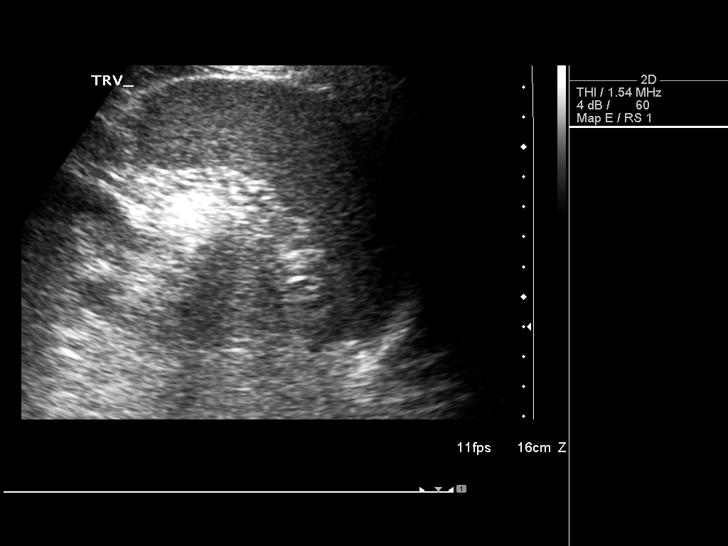

[14 of 25 positions shown; findings below may reference images not displayed]

FINDINGS: Gallbladder:  No gallstones, gallbladder wall thickening, or
pericholecystic fluid.

Common bile duct:  Measures 5 mm.

Liver:  No focal lesion identified.  Hyperechoic hepatic
parenchyma, reflecting hepatic steatosis.

IVC:  Poorly visualized.

Pancreas:  Poorly visualized due to overlying bowel gas.

Spleen:  Measures 8.9 cm.

Right Kidney:  Measures of 0.8 cm.  No mass or hydronephrosis.

Left Kidney:  Measures 11.7 cm.  No mass or hydronephrosis.

Abdominal aorta:  No aneurysm identified.
IMPRESSION: Hepatic steatosis.

## 2014-06-02 DIAGNOSIS — H43399 Other vitreous opacities, unspecified eye: Secondary | ICD-10-CM | POA: Diagnosis not present

## 2014-08-04 DIAGNOSIS — D239 Other benign neoplasm of skin, unspecified: Secondary | ICD-10-CM | POA: Diagnosis not present

## 2014-08-04 DIAGNOSIS — Z85828 Personal history of other malignant neoplasm of skin: Secondary | ICD-10-CM | POA: Diagnosis not present

## 2014-08-04 DIAGNOSIS — L570 Actinic keratosis: Secondary | ICD-10-CM | POA: Diagnosis not present

## 2014-08-04 DIAGNOSIS — L821 Other seborrheic keratosis: Secondary | ICD-10-CM | POA: Diagnosis not present

## 2014-11-09 DIAGNOSIS — Z6825 Body mass index (BMI) 25.0-25.9, adult: Secondary | ICD-10-CM | POA: Diagnosis not present

## 2014-11-09 DIAGNOSIS — R634 Abnormal weight loss: Secondary | ICD-10-CM | POA: Diagnosis not present

## 2014-11-09 DIAGNOSIS — M545 Low back pain: Secondary | ICD-10-CM | POA: Diagnosis not present

## 2014-11-09 DIAGNOSIS — N400 Enlarged prostate without lower urinary tract symptoms: Secondary | ICD-10-CM | POA: Diagnosis not present

## 2014-11-09 DIAGNOSIS — Z23 Encounter for immunization: Secondary | ICD-10-CM | POA: Diagnosis not present

## 2014-11-09 DIAGNOSIS — I10 Essential (primary) hypertension: Secondary | ICD-10-CM | POA: Diagnosis not present

## 2014-11-09 DIAGNOSIS — F419 Anxiety disorder, unspecified: Secondary | ICD-10-CM | POA: Diagnosis not present

## 2014-11-09 DIAGNOSIS — E785 Hyperlipidemia, unspecified: Secondary | ICD-10-CM | POA: Diagnosis not present

## 2014-11-09 DIAGNOSIS — J449 Chronic obstructive pulmonary disease, unspecified: Secondary | ICD-10-CM | POA: Diagnosis not present

## 2015-04-02 DIAGNOSIS — J45909 Unspecified asthma, uncomplicated: Secondary | ICD-10-CM | POA: Diagnosis not present

## 2015-05-11 DIAGNOSIS — E785 Hyperlipidemia, unspecified: Secondary | ICD-10-CM | POA: Diagnosis not present

## 2015-05-11 DIAGNOSIS — Z125 Encounter for screening for malignant neoplasm of prostate: Secondary | ICD-10-CM | POA: Diagnosis not present

## 2015-05-11 DIAGNOSIS — I10 Essential (primary) hypertension: Secondary | ICD-10-CM | POA: Diagnosis not present

## 2015-05-18 DIAGNOSIS — Z6824 Body mass index (BMI) 24.0-24.9, adult: Secondary | ICD-10-CM | POA: Diagnosis not present

## 2015-05-18 DIAGNOSIS — F419 Anxiety disorder, unspecified: Secondary | ICD-10-CM | POA: Diagnosis not present

## 2015-05-18 DIAGNOSIS — Z0000 Encounter for general adult medical examination without abnormal findings: Secondary | ICD-10-CM | POA: Diagnosis not present

## 2015-05-18 DIAGNOSIS — M545 Low back pain: Secondary | ICD-10-CM | POA: Diagnosis not present

## 2015-05-18 DIAGNOSIS — Z1389 Encounter for screening for other disorder: Secondary | ICD-10-CM | POA: Diagnosis not present

## 2015-05-18 DIAGNOSIS — J449 Chronic obstructive pulmonary disease, unspecified: Secondary | ICD-10-CM | POA: Diagnosis not present

## 2015-05-18 DIAGNOSIS — I10 Essential (primary) hypertension: Secondary | ICD-10-CM | POA: Diagnosis not present

## 2015-05-18 DIAGNOSIS — E785 Hyperlipidemia, unspecified: Secondary | ICD-10-CM | POA: Diagnosis not present

## 2015-06-28 DIAGNOSIS — Z23 Encounter for immunization: Secondary | ICD-10-CM | POA: Diagnosis not present

## 2015-08-26 DIAGNOSIS — Z85828 Personal history of other malignant neoplasm of skin: Secondary | ICD-10-CM | POA: Diagnosis not present

## 2015-08-26 DIAGNOSIS — L821 Other seborrheic keratosis: Secondary | ICD-10-CM | POA: Diagnosis not present

## 2015-08-26 DIAGNOSIS — D225 Melanocytic nevi of trunk: Secondary | ICD-10-CM | POA: Diagnosis not present

## 2015-08-26 DIAGNOSIS — L723 Sebaceous cyst: Secondary | ICD-10-CM | POA: Diagnosis not present

## 2015-08-26 DIAGNOSIS — L570 Actinic keratosis: Secondary | ICD-10-CM | POA: Diagnosis not present

## 2015-09-21 DIAGNOSIS — J441 Chronic obstructive pulmonary disease with (acute) exacerbation: Secondary | ICD-10-CM | POA: Diagnosis not present

## 2015-09-21 DIAGNOSIS — I10 Essential (primary) hypertension: Secondary | ICD-10-CM | POA: Diagnosis not present

## 2015-09-21 DIAGNOSIS — J0100 Acute maxillary sinusitis, unspecified: Secondary | ICD-10-CM | POA: Diagnosis not present

## 2015-09-21 DIAGNOSIS — R05 Cough: Secondary | ICD-10-CM | POA: Diagnosis not present

## 2015-09-21 DIAGNOSIS — H1030 Unspecified acute conjunctivitis, unspecified eye: Secondary | ICD-10-CM | POA: Diagnosis not present

## 2015-09-21 DIAGNOSIS — Z6824 Body mass index (BMI) 24.0-24.9, adult: Secondary | ICD-10-CM | POA: Diagnosis not present

## 2015-11-19 DIAGNOSIS — M545 Low back pain: Secondary | ICD-10-CM | POA: Diagnosis not present

## 2015-11-19 DIAGNOSIS — Z6824 Body mass index (BMI) 24.0-24.9, adult: Secondary | ICD-10-CM | POA: Diagnosis not present

## 2015-11-19 DIAGNOSIS — I10 Essential (primary) hypertension: Secondary | ICD-10-CM | POA: Diagnosis not present

## 2015-11-19 DIAGNOSIS — Z79899 Other long term (current) drug therapy: Secondary | ICD-10-CM | POA: Diagnosis not present

## 2015-11-19 DIAGNOSIS — F419 Anxiety disorder, unspecified: Secondary | ICD-10-CM | POA: Diagnosis not present

## 2016-06-16 DIAGNOSIS — Z85828 Personal history of other malignant neoplasm of skin: Secondary | ICD-10-CM | POA: Diagnosis not present

## 2016-06-16 DIAGNOSIS — L814 Other melanin hyperpigmentation: Secondary | ICD-10-CM | POA: Diagnosis not present

## 2016-06-16 DIAGNOSIS — L570 Actinic keratosis: Secondary | ICD-10-CM | POA: Diagnosis not present

## 2016-06-16 DIAGNOSIS — D1801 Hemangioma of skin and subcutaneous tissue: Secondary | ICD-10-CM | POA: Diagnosis not present

## 2016-06-16 DIAGNOSIS — L821 Other seborrheic keratosis: Secondary | ICD-10-CM | POA: Diagnosis not present

## 2016-11-23 DIAGNOSIS — Z23 Encounter for immunization: Secondary | ICD-10-CM | POA: Diagnosis not present

## 2017-08-02 DIAGNOSIS — J45909 Unspecified asthma, uncomplicated: Secondary | ICD-10-CM | POA: Diagnosis not present

## 2017-08-02 DIAGNOSIS — M5136 Other intervertebral disc degeneration, lumbar region: Secondary | ICD-10-CM | POA: Insufficient documentation

## 2017-08-02 DIAGNOSIS — M51369 Other intervertebral disc degeneration, lumbar region without mention of lumbar back pain or lower extremity pain: Secondary | ICD-10-CM | POA: Insufficient documentation

## 2017-08-02 DIAGNOSIS — J209 Acute bronchitis, unspecified: Secondary | ICD-10-CM | POA: Diagnosis not present

## 2017-08-02 DIAGNOSIS — I10 Essential (primary) hypertension: Secondary | ICD-10-CM | POA: Diagnosis not present

## 2017-08-02 DIAGNOSIS — G2581 Restless legs syndrome: Secondary | ICD-10-CM | POA: Insufficient documentation

## 2017-09-05 DIAGNOSIS — I10 Essential (primary) hypertension: Secondary | ICD-10-CM | POA: Diagnosis not present

## 2018-01-02 DIAGNOSIS — Z13228 Encounter for screening for other metabolic disorders: Secondary | ICD-10-CM | POA: Diagnosis not present

## 2018-01-02 DIAGNOSIS — Z79899 Other long term (current) drug therapy: Secondary | ICD-10-CM | POA: Diagnosis not present

## 2018-01-02 DIAGNOSIS — I10 Essential (primary) hypertension: Secondary | ICD-10-CM | POA: Diagnosis not present

## 2018-01-29 DIAGNOSIS — R739 Hyperglycemia, unspecified: Secondary | ICD-10-CM | POA: Diagnosis not present

## 2018-01-29 DIAGNOSIS — I10 Essential (primary) hypertension: Secondary | ICD-10-CM | POA: Diagnosis not present

## 2018-01-29 DIAGNOSIS — Z79899 Other long term (current) drug therapy: Secondary | ICD-10-CM | POA: Diagnosis not present

## 2018-05-29 DIAGNOSIS — I10 Essential (primary) hypertension: Secondary | ICD-10-CM | POA: Diagnosis not present

## 2018-05-29 DIAGNOSIS — E785 Hyperlipidemia, unspecified: Secondary | ICD-10-CM | POA: Diagnosis not present

## 2018-05-29 DIAGNOSIS — R739 Hyperglycemia, unspecified: Secondary | ICD-10-CM | POA: Diagnosis not present

## 2018-09-02 ENCOUNTER — Ambulatory Visit: Payer: Self-pay | Admitting: Family Medicine

## 2019-02-03 DIAGNOSIS — E119 Type 2 diabetes mellitus without complications: Secondary | ICD-10-CM | POA: Insufficient documentation

## 2019-07-08 DIAGNOSIS — M1990 Unspecified osteoarthritis, unspecified site: Secondary | ICD-10-CM | POA: Insufficient documentation

## 2019-10-30 ENCOUNTER — Ambulatory Visit: Payer: Medicare HMO | Attending: Internal Medicine

## 2019-10-30 DIAGNOSIS — Z23 Encounter for immunization: Secondary | ICD-10-CM | POA: Insufficient documentation

## 2019-10-30 NOTE — Progress Notes (Signed)
   Covid-19 Vaccination Clinic  Name:  Jonathon Cooper    MRN: [id number] DOB: [birth year]  10/30/2019  Mr. Hemstreet was observed post Covid-19 immunization for 15 minutes without incidence. He was provided with Vaccine Information Sheet and instruction to access the V-Safe system.   Mr. Tiet was instructed to call 911 with any severe reactions post vaccine: Marland Kitchen Difficulty breathing  . Swelling of your face and throat  . A fast heartbeat  . A bad rash all over your body  . Dizziness and weakness    Immunizations Administered    Name Date Dose VIS Date Route   Pfizer COVID-19 Vaccine 10/30/2019  6:10 PM 0.3 mL 09/19/2019 Intramuscular   Manufacturer: [place]   Lot: [id number]   [place]: [id number]

## 2019-11-20 ENCOUNTER — Ambulatory Visit: Payer: Medicare HMO | Attending: Internal Medicine

## 2019-11-20 DIAGNOSIS — Z23 Encounter for immunization: Secondary | ICD-10-CM | POA: Insufficient documentation

## 2019-11-20 NOTE — Progress Notes (Signed)
   Covid-19 Vaccination Clinic  Name:  Jonathon Cooper    MRN: [id number] DOB: 1951/03/19  11/20/2019  Jonathon Cooper was observed post Covid-19 immunization for 15 minutes without incidence. He was provided with Vaccine Information Sheet and instruction to access the V-Safe system.   Jonathon Cooper was instructed to call 911 with any severe reactions post vaccine: Marland Kitchen Difficulty breathing  . Swelling of your face and throat  . A fast heartbeat  . A bad rash all over your body  . Dizziness and weakness    Immunizations Administered    Name Date Dose VIS Date Route   Pfizer COVID-19 Vaccine 11/20/2019 10:43 AM 0.3 mL 09/19/2019 Intramuscular   Manufacturer: [place]   Lot: [id number]   [place]: [id number]

## 2020-07-20 ENCOUNTER — Ambulatory Visit: Payer: Medicare HMO | Admitting: Internal Medicine

## 2020-07-20 ENCOUNTER — Encounter: Payer: Self-pay | Admitting: Internal Medicine

## 2020-07-20 ENCOUNTER — Other Ambulatory Visit: Payer: Self-pay

## 2020-07-20 VITALS — BP 142/74 | HR 67 | Temp 97.2°F | Ht 66.0 in | Wt 169.9 lb

## 2020-07-20 DIAGNOSIS — R053 Chronic cough: Secondary | ICD-10-CM

## 2020-07-20 DIAGNOSIS — Z87891 Personal history of nicotine dependence: Secondary | ICD-10-CM | POA: Diagnosis not present

## 2020-07-20 NOTE — Patient Instructions (Addendum)
Continue symbicort 2 puffs twice a day. Take the albuterol as needed (see instructions below.)  The patient should have follow up scheduled with myself in 3 months.   Prior to next visit patient should have: Full set of PFTs  Take the albuterol rescue inhaler every 4 to 6 hours as needed for wheezing or shortness of breath. You can also take it 15 minutes before exercise or exertional activity. Side effects include heart racing or pounding, jitters or anxiety. If you have a history of an irregular heart rhythm, it can make this worse. Can also give some patients a hard time sleeping.  To inhale the aerosol using an inhaler, follow these steps:  1. Remove the protective dust cap from the end of the mouthpiece. If the dust cap was not placed on the mouthpiece, check the mouthpiece for dirt or other objects. Be sure that the canister is fully and firmly inserted in the mouthpiece. 2. If you are using the inhaler for the first time or if you have not used the inhaler in more than 14 days, you will need to prime it. You may also need to prime the inhaler if it has been dropped. Ask your pharmacist or check the manufacturer's information if this happens. To prime the inhaler, shake it well and then press down on the canister 4 times to release 4 sprays into the air, away from your face. Be careful not to get albuterol in your eyes. 3. Shake the inhaler well. 4. Breathe out as completely as possible through your mouth. 4. Hold the canister with the mouthpiece on the bottom, facing you and the canister pointing upward. Place the open end of the mouthpiece into your mouth. Close your lips tightly around the mouthpiece. 6. Breathe in slowly and deeply through the mouthpiece.At the same time, press down once on the container to spray the medication into your mouth. 7. Try to hold your breath for 10 seconds. remove the inhaler, and breathe out slowly. 8. If you were told to use 2 puffs, wait 1 minute and  then repeat steps 3-7. 9. Replace the protective cap on the inhaler. 10. Clean your inhaler regularly. Follow the manufacturer's directions carefully and ask your doctor or pharmacist if you have any questions about cleaning your inhaler.  Check the back of the inhaler to keep track of the total number of doses left on the inhaler.         Understanding COPD   What is COPD? COPD stands for chronic obstructive pulmonary (lung) disease. COPD is a general term used for several lung diseases.  COPD is an umbrella term and encompasses other  common diseases in this group like chronic bronchitis and emphysema. Chronic asthma may also be included in this group. While some patients with COPD have only chronic bronchitis or emphysema, most patients have a combination of both.  You might hear these terms used in exchange for one another.   COPD adds to the work of the heart. Diseased lungs may reduce the amount of oxygen that goes to the blood. High blood pressure in blood vessels from the heart to the lungs makes it difficult for the heart to pump. Lung disease can also cause the body to produce too many red blood cells which may make the blood thicker and harder to pump.   Patients who have COPD with low oxygen levels may develop an enlarged heart (cor pulmonale). This condition weakens the heart and causes increased shortness of breath and  swelling in the legs and feet.   Chronic bronchitis Chronic bronchitis is irritation and inflammation (swelling) of the lining in the bronchial tubes (air passages). The irritation causes coughing and an excess amount of mucus in the airways. The swelling makes it difficult to get air in and out of the lungs. The small, hair-like structures on the inside of the airways (called cilia) may be damaged by the irritation. The cilia are then unable to help clean mucus from the airways.  Bronchitis is generally considered to be chronic when you have: a productive cough  (cough up mucus) and shortness of breath that lasts about 3 months or more each year for 2 or more years in a row. Your doctor may define chronic bronchitis differently.   Emphysema Emphysema is the destruction, or breakdown, of the walls of the alveoli (air sacs) located at the end of the bronchial tubes. The damaged alveoli are not able to exchange oxygen and carbon dioxide between the lungs and the blood. The bronchioles lose their elasticity and collapse when you exhale, trapping air in the lungs. The trapped air keeps fresh air and oxygen from entering the lungs.   Who is affected by COPD? Emphysema and chronic bronchitis affect approximately 16 million people in the [place], or close to 11 percent of the population.   Symptoms of COPD   Shortness of breath   Shortness of breath with mild exercise (walking, using the stairs, etc.)   Chronic, productive cough (with mucus)   A feeling of "tightness" in the chest   Wheezing   What causes COPD? The two primary causes of COPD are cigarette smoking and alpha1-antitrypsin (AAT) deficiency. Air pollution and occupational dusts may also contribute to COPD, especially when the person exposed to these substances is a cigarette smoker.  Cigarette smoke causes COPD by irritating the airways and creating inflammation that narrows the airways, making it more difficult to breathe. Cigarette smoke also causes the cilia to stop working properly so mucus and trapped particles are not cleaned from the airways. As a result, chronic cough and excess mucus production develop, leading to chronic bronchitis.  In some people, chronic bronchitis and infections can lead to destruction of the small airways, or emphysema.  AAT deficiency, an inherited disorder, can also lead to emphysema. Alpha antitrypsin (AAT) is a protective material produced in the liver and transported to the lungs to help combat inflammation. When there is not enough of the chemical AAT,  the body is no longer protected from an enzyme in the white blood cells.   How is COPD diagnosed?  To diagnose COPD, the physician needs to know: . Do you smoke?  . Have you had chronic exposure to dust or air pollutants?  . Do other members of your family have lung disease?  Marland Kitchen Are you short of breath?  . Do you get short of breath with exercise?  Marland Kitchen Do you have chronic cough and/or wheezing?  Marland Kitchen Do you cough up excess mucus?  To help with the diagnosis, the physician will conduct a thorough physical exam which includes:  1. Listening to your lungs and heart  2. Checking your blood pressure and pulse  3. Examining your nose and throat  4. Checking your feet and ankles for swelling   Laboratory and other tests Several laboratory and other tests are needed to confirm a diagnosis of COPD. These tests may include:  . Chest X-ray to look for lung changes that could be caused by  COPD  .  Spirometry and pulmonary function tests (PFTs) to determine lung volume and air flow  . Pulse oximetry to measure the saturation of oxygen in the blood  . Arterial blood gases (ABGs) to determine the amount of oxygen and carbon dioxide in the blood  . Exercise testing to determine if the oxygen level in the blood drops during exercise   Treatment In the beginning stages of COPD, there is minimal shortness of breath that may be noticed only during exercise. As the disease progresses, shortness of breath may worsen and you may need to wear an oxygen device.   To help control other symptoms of COPD, the following treatments and lifestyle changes may be prescribed.  . Quitting smoking  . Avoiding cigarette smoke and other irritants  . Taking medications including: a. bronchodilators b. anti-inflammatory agents c. oxygen d. antibiotics  . Maintaining a healthy diet  . Following a structured exercise program such as pulmonary rehabilitation . Preventing respiratory infections  . Controlling stress   If  your COPD progresses, you may be eligible to be evaluated for lung volume reduction surgery or lung transplantation. You may also be eligible to participate in certain clinical trials (research studies). Ask your health care providers about studies being conducted in your hospital.   What is the outlook? Although COPD can not be cured, its symptoms can be treated and your quality of life can be improved. Your prognosis or outlook for the future will depend on how well your lungs are functioning, your symptoms, and how well you respond to and follow your treatment plan.

## 2020-07-20 NOTE — Progress Notes (Signed)
Jonathon Cooper    [id number]    29-Apr-1951  Primary Care Physician:Long, Lucienne Capers  Referring Physician: Shanon Rosser, PA-C [place],  [place] [id number] Reason for Consultation: chronic cough Date of Consultation: 07/20/2020  Chief complaint:   Chief Complaint  Patient presents with  . Consult    prductive cough x 2 months      HPI: Jonathon Cooper is a 69 y.o. gentleman here for chronic cough. Former smoker, quit at age 37, but was heavy at the time 3 ppd. He is an occasional marijuana smoker but quit in august. Additionally has lifelong history of asthma(was born premature). Cough is productive with mucus and occasional black flecks.   In September Had fever to flu shot and chest congestion.  Went to urgent care and was treated with prednisone and azithromycin. Breathing is better now. Has been treated for bronchitis 6-7 times and pneumonia 4 times. Never been hospitalized.   He describes sensitivity to dust and outdoor allergens which lead to bronchitis.  Breathing worse with hot weather. Triggers are dust. Previously triggered by anxiety.   He is a single dad and has a 46 yo daughter. Also has chronic pain from multiple musculoskeletal complaints (spine, elbows, knees.)  Was prescribed symbicort earlier this year. Only takes it when needed.  Albuterol hasn't been covered by insurance so he is using the same one he was prescribed earlier this year.    Dyspnea on exertion.   Social history:  Occupation: plays in a rock band  Smoking history: 15 pack years quit ate age 89. Passive smoke exposure as a child (mother smoked.)  Social History   Occupational History  . Not on file  Tobacco Use  . Smoking status: Former Smoker    Packs/day: 3.00    Years: 15.00    Pack years: 45.00    Types: Cigarettes  . Smokeless tobacco: Never Used  Substance and Sexual Activity  . Alcohol use: Not on file  . Drug use: Not on file  . Sexual activity:  Not on file    Relevant family history:  Family History  Problem Relation Age of Onset  . Lung cancer Mother   . Asthma Neg Hx     Past Medical History:  Diagnosis Date  . Asthma   . Bronchiectasis (Early)   . Pneumonia     Past Surgical History:  Procedure Laterality Date  . ELBOW SURGERY    . SPINAL FUSION  2002  . WRIST SURGERY  2004     Physical Exam: Blood pressure (!) 142/74, pulse 67, temperature (!) 97.2 F (36.2 C), temperature source Temporal, height 5\' 6"  (1.676 m), weight 169 lb 14.4 oz (77.1 kg), SpO2 98 %. Gen:      No acute distress ENT:  no nasal polyps, mucus membranes moist Lungs:    No increased respiratory effort, symmetric chest wall excursion, clear to auscultation bilaterally, no wheezes or crackles CV:         Regular rate and rhythm; no murmurs, rubs, or gallops.  No pedal edema Abd:      + bowel sounds; soft, non-tender; no distension MSK: no acute synovitis of DIP or PIP joints, no mechanics hands. Right bicep muscle head detatched.  Skin:      Warm and dry; no rashes, multiple tattoos. Neuro: normal speech, no focal facial asymmetry Psych: alert and oriented x3, normal mood and affect   Data Reviewed/Medical Decision  Making:  Independent interpretation of tests: Imaging: No chest imaging to review. Outside records of chest xray demonstrate hyperinflation.   PFTs: None on file  Labs: CBC and CMP reviewed.  Albumin is low at 2.9 and absolute eosinophil count is 525 (elevated.) scanned into chart.  Immunization status:  Immunization History  Administered Date(s) Administered  . Influenza-Unspecified 07/08/2019  . PFIZER SARS-COV-2 Vaccination 10/30/2019, 11/20/2019, 06/23/2020    . I reviewed prior external note(s) from PCP . I reviewed the result(s) of the labs and imaging as noted above.  . I have ordered PFT  Assessment:  Asthma COPD overlap syndrome.  Plan/Recommendations: Continue symbicort 2 puffs BID. Continue albuterol  prn. Will obtain full set of PFTs.   We discussed disease management and progression at length today regarding COPD.   Return to Care: Return in about 3 months (around 10/20/2020).  Lenice Llamas, MD [facility] [place]  CC: Long, [place], [place]

## 2021-06-17 DIAGNOSIS — E785 Hyperlipidemia, unspecified: Secondary | ICD-10-CM | POA: Insufficient documentation

## 2021-09-09 NOTE — Progress Notes (Signed)
Patient referred by Shanon Rosser, PA-C for murmur  Subjective:   Jonathon Cooper, male    DOB: 09-26-1951, 70 y.o.   MRN: [id number]   Chief Complaint  Patient presents with   Heart Murmur   New Patient (Initial Visit)     HPI   70 y.o. Caucasian male with hypertension, asthma, referred for evaluation of murmur  Patient is a Therapist, nutritional.  He reports regular physical activity has been down in the last few months.  Denies any complaints of chest pain, shortness of breath, leg swelling.  Recently, he was found to have normal physical exam by his PCP.  Past Medical History:  Diagnosis Date   Asthma    Bronchiectasis ([place])    Pneumonia      Past Surgical History:  Procedure Laterality Date   ELBOW SURGERY     SPINAL FUSION  2002   WRIST SURGERY  2004     Social History   Tobacco Use  Smoking Status Former   Packs/day: 3.00   Years: 15.00   Pack years: 45.00   Types: Cigarettes  Smokeless Tobacco Never    Social History   Substance and Sexual Activity  Alcohol Use None     Family History  Problem Relation Age of Onset   Lung cancer Mother    Asthma Neg Hx      Current Outpatient Medications on File Prior to Visit  Medication Sig Dispense Refill   albuterol (PROVENTIL HFA) 108 (90 Base) MCG/ACT inhaler Proventil HFA 90 mcg/actuation aerosol inhaler  Inhale 2 puffs every 4 hours by inhalation route as needed.     atorvastatin (LIPITOR) 40 MG tablet      budesonide-formoterol (SYMBICORT) 160-4.5 MCG/ACT inhaler Symbicort 160 mcg-4.5 mcg/actuation HFA aerosol inhaler     lisinopril (ZESTRIL) 40 MG tablet      meloxicam (MOBIC) 15 MG tablet meloxicam 15 mg tablet  TAKE 1 TABLET BY MOUTH EVERY DAY     metFORMIN (GLUCOPHAGE) 500 MG tablet metformin 500 mg tablet  Take 1 tablet twice a day by oral route for 90 days.     montelukast (SINGULAIR) 10 MG tablet montelukast 10 mg tablet  Take 1 tablet every day by oral route for 90 days.     No current  facility-administered medications on file prior to visit.    Cardiovascular and other pertinent studies:  EKG 09/12/2021: Sinus rhythm 93 bpm with rate variation RSR(V1) -nondiagnostic    Recent labs: 05/24/2021: Glucose 102, BUN/Cr 12/1.01. EGFR 81. Na/K 140/4.5. H/H 15.6/46.3. MCV 92.6. Platelets 247 HbA1C 5.6% Chol 124, TG 127, HDL 40, LDL 63    Review of Systems  Cardiovascular:  Negative for chest pain, dyspnea on exertion, leg swelling, palpitations and syncope.  Psychiatric/Behavioral:  The patient is nervous/anxious.         Vitals:   09/12/21 1303 09/12/21 1312  BP: (!) 153/93 (!) 150/80  Pulse: 91 91  Resp: 16   Temp: 98 F (36.7 C)   SpO2: 95%      Body mass index is 27.76 kg/m. Filed Weights   09/12/21 1303  Weight: 172 lb (78 kg)     Objective:   Physical Exam Vitals and nursing note reviewed.  Constitutional:      General: He is not in acute distress. Neck:     Vascular: No JVD.  Cardiovascular:     Rate and Rhythm: Normal rate. Rhythm irregular. FrequentExtrasystoles are present.    Heart sounds: Murmur heard.  High-pitched blowing holosystolic murmur is present with a grade of 3/6 at the apex.  Pulmonary:     Effort: Pulmonary effort is normal.     Breath sounds: Normal breath sounds. No wheezing or rales.  Musculoskeletal:     Right lower leg: No edema.     Left lower leg: No edema.        Assessment & Recommendations:   70 y.o. Caucasian male with hypertension, asthma, referred for evaluation of murmur  Murmur: Likely mitral regurgitation based on physical examination.  Irregular heart rhythm on auscultation, but EKG shows sinus rhythm with rate variation.  Will obtain echocardiogram and further cardiac telemetry to evaluate for A. fib.  Hypertension: Generally well controlled at home.  No changes made today.  Further recommendations after above testing.  Thank you for referring the patient to [place]. Please feel free to  contact with any questions.   Nigel Mormon, MD Pager: [phone] Office: [phone]

## 2021-09-12 ENCOUNTER — Other Ambulatory Visit: Payer: Self-pay

## 2021-09-12 ENCOUNTER — Encounter: Payer: Self-pay | Admitting: Cardiology

## 2021-09-12 ENCOUNTER — Ambulatory Visit: Payer: Medicare HMO | Admitting: Cardiology

## 2021-09-12 VITALS — BP 150/80 | HR 91 | Temp 98.0°F | Resp 16 | Ht 66.0 in | Wt 172.0 lb

## 2021-09-12 DIAGNOSIS — I499 Cardiac arrhythmia, unspecified: Secondary | ICD-10-CM | POA: Insufficient documentation

## 2021-09-12 DIAGNOSIS — I10 Essential (primary) hypertension: Secondary | ICD-10-CM | POA: Insufficient documentation

## 2021-09-12 DIAGNOSIS — R011 Cardiac murmur, unspecified: Secondary | ICD-10-CM

## 2021-09-15 ENCOUNTER — Other Ambulatory Visit (HOSPITAL_COMMUNITY): Payer: Self-pay

## 2021-09-15 ENCOUNTER — Encounter: Payer: Self-pay | Admitting: Internal Medicine

## 2021-09-15 ENCOUNTER — Other Ambulatory Visit: Payer: Self-pay

## 2021-09-15 ENCOUNTER — Ambulatory Visit: Payer: Medicare HMO | Admitting: Internal Medicine

## 2021-09-15 VITALS — BP 132/72 | HR 91 | Temp 98.0°F | Ht 66.0 in | Wt 164.6 lb

## 2021-09-15 DIAGNOSIS — J449 Chronic obstructive pulmonary disease, unspecified: Secondary | ICD-10-CM

## 2021-09-15 LAB — POCT EXHALED NITRIC OXIDE: FeNO level (ppb): 15

## 2021-09-15 MED ORDER — BUDESONIDE-FORMOTEROL FUMARATE 160-4.5 MCG/ACT IN AERO: 2.0000 | INHALATION_SPRAY | Freq: Two times a day (BID) | RESPIRATORY_TRACT | 5 refills | Status: DC

## 2021-09-15 NOTE — Progress Notes (Signed)
Jonathon Cooper    [id number]    [birth year]  [facility] Date of Appointment: 09/15/2021 Established Patient Visit  Chief complaint:   Chief Complaint  Patient presents with   Follow-up     HPI: Jonathon Cooper is a 70 y.o. man with Asthma COPD overlap syndrome.  Interval Updates: Here for follow up after over a year.  Was taking symbicort twice a day and ran out a few days ago. He feels this helps a lot.    He has a nebulizer machine at home now and uses nebulizer treatments three times a week on average.   He has needed steroids twice in the last year, both times after getting covid booster.  He had some chills and weakness after getting covid boosters.   He notes he coughs up black substance mixed with sputum and has photos of his sputum to show me. This was for a few days and have now resolved. He wonders if he had mold exposure in either his Jeep carpet or his AC unit.   Current Regimen: symbicort 160 2 puffs twice a day Asthma Triggers: URIs, covid booster/flu shot Exacerbations in the last year: two requiring prednisone History of hospitalization or intubation: none Allergy Testing: denies GERD: denies Allergic Rhinitis: sometimes ACT:  Asthma Control Test ACT Total Score  09/15/2021 18   FeNO: 15 ppb   I have reviewed the patient's family social and past medical history and updated as appropriate.   Past Medical History:  Diagnosis Date   Asthma    Bronchiectasis ([place])    Pneumonia     Past Surgical History:  Procedure Laterality Date   ELBOW SURGERY     SPINAL FUSION  2002   WRIST SURGERY  2004    Family History  Problem Relation Age of Onset   Heart disease Mother    Lung cancer Mother    Asthma Neg Hx     Social History   Occupational History   Not on file  Tobacco Use   Smoking status: Former    Packs/day: 3.00    Years: 15.00    Pack years: 45.00    Types: Cigarettes    Quit date: 1989     Years since quitting: 33.9   Smokeless tobacco: Never  Vaping Use   Vaping Use: Never used  Substance and Sexual Activity   Alcohol use: Yes    Comment: occ   Drug use: Yes    Types: Marijuana   Sexual activity: Not on file     Physical Exam: Blood pressure 132/72, pulse 91, temperature 98 F (36.7 C), height 5\' 6"  (1.676 m), weight 164 lb 9.6 oz (74.7 kg), SpO2 97 %.  Gen:      No acute distress ENT:  no nasal polyps, mucus membranes moist Lungs:    No increased respiratory effort, symmetric chest wall excursion, clear to auscultation bilaterally, no wheezes or crackles CV:         RRR systolic murmur, some ectopic beats possible PACs   Data Reviewed: Imaging: Previous chest xray 06/2020 shows hyperinflation  PFTs: No flowsheet data found. Spirometry and FeNO obtained 09/15/21 shows normal spirometry  Feno 15 ppb  Labs:  Immunization status: Immunization History  Administered Date(s) Administered   Fluad Quad(high Dose 65+) 06/29/2020   Influenza,inj,Quad PF,6+ Mos 09/23/2018   Influenza-Unspecified 07/08/2019   PFIZER(Purple Top)SARS-COV-2 Vaccination 10/30/2019, 11/20/2019, 06/23/2020    External Records Personally  Reviewed: cardiology, primary care  Assessment:  Asthma COPD overlap syndrome, not well controlled requiring prednisone twice in the last year.   Plan/Recommendations:  Continue symbicort. He is in the donut hole right now and cannot afford any medications. Unfortunately we don't have any samples.   Will do benefits inquiry for albuterol so he can keep this with him.   Can switch to airduo for the month which is $40 through [facility].    Return to Care: Return in about 4 months (around 01/14/2022).   Lenice Llamas, MD Pulmonary and [place]

## 2021-09-15 NOTE — Patient Instructions (Addendum)
Please schedule follow up scheduled with myself in 4 months.  If my schedule is not open yet, we will contact you with a reminder closer to that time. Please call [phone] if you haven't heard from [place] a month before.     The cheapest inhaler I can find without insurance right now is Airduo which is filled at [facility] for $41. This is an alternative to symbicort. Let me know if you'd rather try that.    Take the albuterol rescue inhaler every 4 to 6 hours as needed for wheezing or shortness of breath. You can also take it 15 minutes before exercise or exertional activity. Side effects include heart racing or pounding, jitters or anxiety. If you have a history of an irregular heart rhythm, it can make this worse. Can also give some patients a hard time sleeping.

## 2021-09-20 ENCOUNTER — Other Ambulatory Visit (HOSPITAL_COMMUNITY): Payer: Self-pay

## 2021-09-22 ENCOUNTER — Other Ambulatory Visit: Payer: Self-pay

## 2021-09-22 ENCOUNTER — Ambulatory Visit: Payer: Medicare HMO

## 2021-09-22 ENCOUNTER — Inpatient Hospital Stay: Payer: Medicare HMO

## 2021-09-22 DIAGNOSIS — I499 Cardiac arrhythmia, unspecified: Secondary | ICD-10-CM

## 2021-09-22 DIAGNOSIS — R011 Cardiac murmur, unspecified: Secondary | ICD-10-CM

## 2021-09-27 MED ORDER — ADVAIR HFA 115-21 MCG/ACT IN AERO: 2.0000 | INHALATION_SPRAY | Freq: Two times a day (BID) | RESPIRATORY_TRACT | 12 refills | Status: DC

## 2021-09-27 NOTE — Addendum Note (Signed)
Addended by: Lenice Llamas on: 09/27/2021 05:46 PM   Modules accepted: Orders

## 2021-10-20 ENCOUNTER — Ambulatory Visit: Payer: Medicare HMO | Admitting: Cardiology

## 2021-10-20 ENCOUNTER — Encounter: Payer: Self-pay | Admitting: Cardiology

## 2021-10-20 ENCOUNTER — Other Ambulatory Visit: Payer: Self-pay

## 2021-10-20 VITALS — BP 147/83 | HR 87 | Temp 98.0°F | Resp 16 | Ht 66.0 in | Wt 164.0 lb

## 2021-10-20 DIAGNOSIS — I10 Essential (primary) hypertension: Secondary | ICD-10-CM

## 2021-10-20 DIAGNOSIS — R011 Cardiac murmur, unspecified: Secondary | ICD-10-CM

## 2021-10-20 NOTE — Progress Notes (Signed)
Patient referred by Shanon Rosser, PA-C for murmur  Subjective:   Jonathon Cooper, male    DOB: June 29, 1951, 71 y.o.   MRN: [id number]   Chief Complaint  Patient presents with   Heart Murmur   Follow-up    4 week     HPI   71 y.o. Caucasian male with hypertension, asthma, referred for evaluation of murmur  Review synthesis of the patient converted to still.  Patient has had only occasional episodes of palpitations.  He is very emotional talking about his struggles with loneliness, anxiety, possible depression.  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  Initial consultation visit 09/2021: Patient is a Therapist, nutritional.  He reports regular physical activity has been down in the last few months.  Denies any complaints of chest pain, shortness of breath, leg swelling.  Recently, he was found to have normal physical exam by his PCP.  Current Outpatient Medications on File Prior to Visit  Medication Sig Dispense Refill   albuterol (VENTOLIN HFA) 108 (90 Base) MCG/ACT inhaler Proventil HFA 90 mcg/actuation aerosol inhaler  Inhale 2 puffs every 4 hours by inhalation route as needed.     amLODipine (NORVASC) 5 MG tablet Take 5 mg by mouth daily.     atorvastatin (LIPITOR) 40 MG tablet      cyclobenzaprine (FLEXERIL) 10 MG tablet Take 10 mg by mouth 3 (three) times daily as needed.     fluticasone-salmeterol (ADVAIR HFA) 115-21 MCG/ACT inhaler Inhale 2 puffs into the lungs 2 (two) times daily. 1 each 12   ipratropium-albuterol (DUONEB) 0.5-2.5 (3) MG/3ML SOLN Take 3 mLs by nebulization 4 (four) times daily.     lisinopril (ZESTRIL) 40 MG tablet      meloxicam (MOBIC) 15 MG tablet meloxicam 15 mg tablet  TAKE 1 TABLET BY MOUTH EVERY DAY     metFORMIN (GLUCOPHAGE) 500 MG tablet metformin 500 mg tablet  Take 1 tablet twice a day by oral route for 90 days.     No current facility-administered medications on file prior to visit.    Cardiovascular and  other pertinent studies:  Mobile cardiac telemetry 7 days 09/22/2021 - 09/29/2021: Dominant rhythm: Sinus. HR 58-141 bpm. Avg HR 89 bpm. 1 episode of probable ectopic atrial tachycardia at 141 bpm for 7 beats. 1.7% isolated SVE, <1% couplet/triplets. 0 episodes of VT. <1% isolated VE, couplets. No atrial fibrillation/atrial flutter/VT/high grade AV block, sinus pause >3sec noted. 3 patient triggered events, correlated with sinus rhythm/SVE.  Echocardiogram 09/22/2021:  Normal LV systolic function with visual EF 60-65%. Left ventricle cavity  is normal in size. Normal left ventricular wall thickness. Normal global  wall motion. Normal diastolic filling pattern, normal LAP.  Aortic valve sclerosis without stenosis. Mild (Grade I) aortic  regurgitation.  Mild (Grade I) mitral regurgitation.  Mild pulmonic regurgitation.  No prior study for comparison.  EKG 09/12/2021: Sinus rhythm 93 bpm with rate variation RSR(V1) -nondiagnostic    Recent labs: 05/24/2021: Glucose 102, BUN/Cr 12/1.01. EGFR 81. Na/K 140/4.5. H/H 15.6/46.3. MCV 92.6. Platelets 247 HbA1C 5.6% Chol 124, TG 127, HDL 40, LDL 63    Review of Systems  Cardiovascular:  Negative for chest pain, dyspnea on exertion, leg swelling, palpitations and syncope.  Psychiatric/Behavioral:  The patient is nervous/anxious.         Vitals:   10/20/21 1352  BP: (!) 147/83  Pulse: 87  Resp: 16  Temp: 98 F (36.7 C)  SpO2:  96%     Body mass index is 26.47 kg/m. Filed Weights   10/20/21 1352  Weight: 164 lb (74.4 kg)     Objective:   Physical Exam Vitals and nursing note reviewed.  Constitutional:      General: He is not in acute distress. Neck:     Vascular: No JVD.  Cardiovascular:     Rate and Rhythm: Normal rate. Rhythm irregular. FrequentExtrasystoles are present.    Heart sounds: Murmur heard.  High-pitched blowing holosystolic murmur is present with a grade of 3/6 at the apex.  Pulmonary:      Effort: Pulmonary effort is normal.     Breath sounds: Normal breath sounds. No wheezing or rales.  Musculoskeletal:     Right lower leg: No edema.     Left lower leg: No edema.  Psychiatric:        Mood and Affect: Affect is tearful.        Assessment & Recommendations:   71 y.o. Caucasian male with hypertension, asthma, referred for evaluation of murmur  No murmurs due to mild regurgitation aortic, mitral, and tricuspid valve, clinically nonsignificant.  Occasional palpitations are likely due to PACs.  However, overall, I am more concerned about his probable depression.  I have encouraged him to seek help with a therapist or psychiatrist for management of the same.  Fortunately, he denies any suicidal ideations at any point.  Hypertension: Generally well controlled at home.  No changes made today.  I will see him on as-needed basis.   Nigel Mormon, MD Pager: [phone] Office: [phone]

## 2022-07-27 ENCOUNTER — Ambulatory Visit: Payer: Medicare HMO | Admitting: Internal Medicine

## 2022-07-27 ENCOUNTER — Encounter: Payer: Self-pay | Admitting: Internal Medicine

## 2022-07-27 VITALS — BP 130/70 | HR 82 | Ht 66.0 in | Wt 157.2 lb

## 2022-07-27 DIAGNOSIS — J4489 Other specified chronic obstructive pulmonary disease: Secondary | ICD-10-CM

## 2022-07-27 DIAGNOSIS — J301 Allergic rhinitis due to pollen: Secondary | ICD-10-CM

## 2022-07-27 MED ORDER — BREZTRI AEROSPHERE 160-9-4.8 MCG/ACT IN AERO: 2.0000 | INHALATION_SPRAY | Freq: Two times a day (BID) | RESPIRATORY_TRACT | 11 refills | Status: AC

## 2022-07-27 MED ORDER — BREZTRI AEROSPHERE 160-9-4.8 MCG/ACT IN AERO: 2.0000 | INHALATION_SPRAY | Freq: Two times a day (BID) | RESPIRATORY_TRACT | 0 refills | Status: AC

## 2022-07-27 MED ORDER — ALBUTEROL SULFATE (2.5 MG/3ML) 0.083% IN NEBU: 2.5000 mg | INHALATION_SOLUTION | Freq: Four times a day (QID) | RESPIRATORY_TRACT | 12 refills | Status: AC | PRN

## 2022-07-27 NOTE — Progress Notes (Signed)
Jonathon Cooper    [id number]    04/05/1951  [facility] Date of Appointment: 07/27/2022 Established Patient Visit  Chief complaint:   Chief Complaint  Patient presents with   asthma follow up    HPI: Jonathon Cooper is a 71 y.o. man with Asthma COPD overlap syndrome.  Interval Updates: Here for follow up for asthma copd overlap syndrome.  No interval hospitalizations or ED visits require steroids.  Still coughs up mucus daily with occasional specks of dried blood or dirt from his house.  He is worried the air conditioning ducts are dirty. He says he uses a HEPA filter for HVAC.   Feels that day to day breathing has gotten worse.   He is still on symbicort 2 puffs twice a day.   Using nebulizer treatments twice a day.   Current Regimen: symbicort 160 2 puffs twice a day, prn albuterol Asthma Triggers: URIs, covid booster/flu shot, environmental allergies Exacerbations in the last year: none requiring prednisone.  History of hospitalization or intubation: none Allergy Testing: denies GERD: denies Allergic Rhinitis: yes on montelukast. Does well on this ACT:  Asthma Control Test ACT Total Score  07/27/2022  2:15 PM 16  09/15/2021 11:21 AM 18   FeNO: 15 ppb   I have reviewed the patient's family social and past medical history and updated as appropriate.   Past Medical History:  Diagnosis Date   Asthma    Bronchiectasis ([place])    Pneumonia     Past Surgical History:  Procedure Laterality Date   ELBOW SURGERY     SPINAL FUSION  2002   WRIST SURGERY  2004    Family History  Problem Relation Age of Onset   Heart disease Mother    Lung cancer Mother    Asthma Neg Hx     Social History   Occupational History   Not on file  Tobacco Use   Smoking status: Former    Packs/day: 3.00    Years: 15.00    Total pack years: 45.00    Types: Cigarettes    Quit date: 1989    Years since quitting: 34.8   Smokeless  tobacco: Never  Vaping Use   Vaping Use: Never used  Substance and Sexual Activity   Alcohol use: Yes    Comment: occ   Drug use: Yes    Types: Marijuana   Sexual activity: Not on file     Physical Exam: Blood pressure 130/70, pulse 82, height '5\' 6"'$  (1.676 m), weight 157 lb 3.2 oz (71.3 kg), SpO2 94 %.  Gen:      No acute distress Lungs:    ctab no wheezes or crackles CV:        RRR, soft systolic murmur   Data Reviewed: Imaging: Previous chest xray 06/2020 shows hyperinflation  PFTs:      No data to display         Spirometry and FeNO obtained 09/15/21 shows normal spirometry  Feno 15 ppb  Labs:  Immunization status: Immunization History  Administered Date(s) Administered   Fluad Quad(high Dose 65+) 06/29/2020   Influenza Inj Mdck Quad Pf 07/08/2019   Influenza,inj,Quad PF,6+ Mos 09/23/2018   Influenza-Unspecified 07/08/2019   PFIZER(Purple Top)SARS-COV-2 Vaccination 10/30/2019, 11/20/2019, 06/23/2020    External Records Personally Reviewed: cardiology, primary care  Assessment:  Asthma COPD overlap syndrome, not well controlled with progression of symptoms.   Plan/Recommendations:  Escalate treatment to breztri  2 puffs twice a day.  Continue prn albuterol.    Return to Care: Return in about 6 months (around 01/26/2023).   Lenice Llamas, MD Pulmonary and [place]

## 2022-07-27 NOTE — Patient Instructions (Addendum)
Please schedule follow up scheduled with myself in 6 months.  If my schedule is not open yet, we will contact you with a reminder closer to that time. Please call [phone] if you haven't heard from [place] a month before.   Stop symbicort. Will switch you to Breztri 2 puffs twice a day.  Stop duoneb nebulizer treatment - switch to albuterol nebulizer treatments only.  Come see [place] sooner if any issues with breathing. Continue montelukast for allergies.

## 2022-09-29 ENCOUNTER — Encounter: Payer: Self-pay | Admitting: Gastroenterology

## 2022-10-24 ENCOUNTER — Ambulatory Visit: Payer: Medicare HMO | Admitting: *Deleted

## 2022-10-24 VITALS — Ht 66.0 in | Wt 150.0 lb

## 2022-10-24 DIAGNOSIS — Z1211 Encounter for screening for malignant neoplasm of colon: Secondary | ICD-10-CM

## 2022-10-24 MED ORDER — NA SULFATE-K SULFATE-MG SULF 17.5-3.13-1.6 GM/177ML PO SOLN: 1.0000 | Freq: Once | ORAL | 0 refills | Status: AC

## 2022-10-24 NOTE — Progress Notes (Signed)
No egg or soy allergy known to patient  No issues known to pt with past sedation with any surgeries or procedures Patient denies ever being told they had issues or difficulty with intubation  No FH of Malignant Hyperthermia Pt is not on diet pills Pt is not on  home 02  Pt is not on blood thinners  Pt denies issues with constipation  Pt is not on dialysis Pt denies any upcoming cardiac testing Pt encouraged to use to use Singlecare or Goodrx to reduce cost  Patient's chart reviewed by Osvaldo Angst CNRA prior to previsit and patient appropriate for the [facility].  Previsit completed and red dot placed by patient's name on their procedure day (on provider's schedule).  . Visit by phone Instructions sent by mail with coupon Instructed not to use CBD Gummies day before and day of procedure

## 2022-11-06 ENCOUNTER — Encounter: Payer: Self-pay | Admitting: Gastroenterology

## 2022-11-13 ENCOUNTER — Ambulatory Visit (AMBULATORY_SURGERY_CENTER): Payer: Medicare HMO | Admitting: Gastroenterology

## 2022-11-13 ENCOUNTER — Encounter: Payer: Self-pay | Admitting: Gastroenterology

## 2022-11-13 ENCOUNTER — Telehealth: Payer: Self-pay | Admitting: Gastroenterology

## 2022-11-13 VITALS — BP 116/59 | HR 89 | Temp 98.5°F | Resp 12 | Ht 66.0 in | Wt 150.0 lb

## 2022-11-13 DIAGNOSIS — K635 Polyp of colon: Secondary | ICD-10-CM

## 2022-11-13 DIAGNOSIS — Z1211 Encounter for screening for malignant neoplasm of colon: Secondary | ICD-10-CM | POA: Diagnosis not present

## 2022-11-13 DIAGNOSIS — D120 Benign neoplasm of cecum: Secondary | ICD-10-CM

## 2022-11-13 MED ORDER — SODIUM CHLORIDE 0.9 % IV SOLN: 500.0000 mL | Freq: Once | INTRAVENOUS | Status: DC

## 2022-11-13 NOTE — Progress Notes (Signed)
Pt resting comfortably. VSS. Airway intact. SBAR complete to RN. All questions answered.   

## 2022-11-13 NOTE — Op Note (Signed)
[place] Patient Name: Jonathon Cooper Procedure Date: 11/13/2022 2:29 PM MRN: [id number] Endoscopist: Remo Lipps P. Havery [name] , MD, [id number] Age: 72 Referring MD:  Date of Birth: [birth year] Gender: Male Account #: [id number] Procedure:                Colonoscopy Indications:              Screening for colorectal malignant neoplasm Medicines:                Monitored Anesthesia Care Procedure:                Pre-Anesthesia Assessment:                           - Prior to the procedure, a History and Physical                            was performed, and patient medications and                            allergies were reviewed. The patient's tolerance of                            previous anesthesia was also reviewed. The risks                            and benefits of the procedure and the sedation                            options and risks were discussed with the patient.                            All questions were answered, and informed consent                            was obtained. Prior Anticoagulants: The patient has                            taken no anticoagulant or antiplatelet agents. ASA                            Grade Assessment: III - A patient with severe                            systemic disease. After reviewing the risks and                            benefits, the patient was deemed in satisfactory                            condition to undergo the procedure.                           After obtaining informed consent, the colonoscope  was passed under direct vision. Throughout the                            procedure, the patient's blood pressure, pulse, and                            oxygen saturations were monitored continuously. The                            Olympus PCF-H190DL (HC#[id number]) Colonoscope was                            introduced through the anus and advanced to the the                            cecum,  identified by appendiceal orifice and                            ileocecal valve. The colonoscopy was performed                            without difficulty. The patient tolerated the                            procedure well. The quality of the bowel                            preparation was good. The ileocecal valve,                            appendiceal orifice, and rectum were photographed. Scope In: 2:44:41 PM Scope Out: 3:06:35 PM Scope Withdrawal Time: 0 hours 15 minutes 43 seconds  Total Procedure Duration: 0 hours 21 minutes 54 seconds  Findings:                 The perianal and digital rectal examinations were                            normal.                           A diminutive polyp was found in the cecum. The                            polyp was sessile. The polyp was removed with a                            cold snare. Resection and retrieval were complete.                           A 5 mm polyp was found in the ileocecal valve. The                            polyp was sessile. The polyp was removed with a  cold snare. Resection and retrieval were complete.                           A few small-mouthed diverticula were found in the                            sigmoid colon.                           Internal hemorrhoids were found during retroflexion.                           The exam was otherwise without abnormality. Complications:            No immediate complications. Estimated blood loss:                            Minimal. Estimated Blood Loss:     Estimated blood loss was minimal. Impression:               - One diminutive polyp in the cecum, removed with a                            cold snare. Resected and retrieved.                           - One 5 mm polyp at the ileocecal valve, removed                            with a cold snare. Resected and retrieved.                           - Diverticulosis in the sigmoid colon.                            - Internal hemorrhoids.                           - The examination was otherwise normal. Recommendation:           - Patient has a contact number available for                            emergencies. The signs and symptoms of potential                            delayed complications were discussed with the                            patient. Return to normal activities tomorrow.                            Written discharge instructions were provided to the                            patient.                           -  Resume previous diet.                           - Continue present medications.                           - Await pathology results. Remo Lipps P. Johnston Maddocks, MD 11/13/2022 3:10:44 PM This report has been signed electronically.

## 2022-11-13 NOTE — Progress Notes (Signed)
VS by CW  Pt's states no medical or surgical changes since previsit or office visit.  

## 2022-11-13 NOTE — Progress Notes (Signed)
[facility] History and Physical   Primary Care Physician:  Shanon Rosser, PA-C   Reason for Procedure:   Colon cancer screening  Plan:    colonoscopy     HPI: Jonathon Cooper is a 72 y.o. male  here for colonoscopy screening. Last exam reportedly age 72 and normal.  . Patient denies any bowel symptoms at this time. No family history of colon cancer known. Otherwise feels well without any cardiopulmonary symptoms.   I have discussed risks / benefits of anesthesia and endoscopic procedure with Colin Broach and they wish to proceed with the exams as outlined today.    Past Medical History:  Diagnosis Date   Asthma    Bronchiectasis ([place])    Diabetes mellitus without complication ([place])    GERD (gastroesophageal reflux disease)    Heart murmur    Pneumonia    Sleep apnea     Past Surgical History:  Procedure Laterality Date   ELBOW SURGERY     SPINAL FUSION  2002   WRIST SURGERY  2004    Prior to Admission medications   Medication Sig Start Date End Date Taking? Authorizing Provider  amLODipine (NORVASC) 5 MG tablet Take 5 mg by mouth daily.   Yes [provider]  atorvastatin (LIPITOR) 40 MG tablet  07/03/20  Yes [provider]  cyclobenzaprine (FLEXERIL) 10 MG tablet Take 10 mg by mouth 3 (three) times daily as needed. 08/12/21  Yes [provider]  gabapentin (NEURONTIN) 300 MG capsule TAKE 1 CAPSULE BY MOUTH EVERYDAY AT BEDTIME   Yes [provider]  lisinopril (ZESTRIL) 40 MG tablet  04/04/20  Yes [provider]  meloxicam (MOBIC) 15 MG tablet meloxicam 15 mg tablet  TAKE 1 TABLET BY MOUTH EVERY DAY   Yes [provider]  metFORMIN (GLUCOPHAGE) 500 MG tablet metformin 500 mg tablet  Take 1 tablet twice a day by oral route for 90 days.   Yes [provider]  montelukast (SINGULAIR) 10 MG tablet Take 1 tablet by mouth daily. 07/14/22  Yes [provider]  SYMBICORT 160-4.5 MCG/ACT  inhaler Inhale into the lungs. 11/09/22  Yes [provider]  albuterol (PROVENTIL) (2.5 MG/3ML) 0.083% nebulizer solution Take 3 mLs (2.5 mg total) by nebulization every 6 (six) hours as needed for wheezing or shortness of breath. 07/27/22   Spero Geralds, MD  albuterol (VENTOLIN HFA) 108 (90 Base) MCG/ACT inhaler Proventil HFA 90 mcg/actuation aerosol inhaler  Inhale 2 puffs every 4 hours by inhalation route as needed. Patient not taking: Reported on 07/27/2022    [provider]  ALPRAZolam Duanne Moron) 0.5 MG tablet take 1 po tid prn severe panic anxiety emotional spells 09/28/22   [provider]  Budeson-Glycopyrrol-Formoterol (BREZTRI AEROSPHERE) 160-9-4.8 MCG/ACT AERO Inhale 2 puffs into the lungs in the morning and at bedtime. Patient not taking: Reported on 11/13/2022 07/27/22   Spero Geralds, MD  Budeson-Glycopyrrol-Formoterol (BREZTRI AEROSPHERE) 160-9-4.8 MCG/ACT AERO Inhale 2 puffs into the lungs in the morning and at bedtime. Patient not taking: Reported on 11/13/2022 07/27/22   Spero Geralds, MD  fluticasone-salmeterol (ADVAIR HFA) [phone] MCG/ACT inhaler     [provider]  ipratropium-albuterol (DUONEB) 0.5-2.5 (3) MG/3ML SOLN Take 3 mLs by nebulization 4 (four) times daily. 10/30/22   [provider]    Current Outpatient Medications  Medication Sig Dispense Refill   amLODipine (NORVASC) 5 MG tablet Take 5 mg by mouth daily.     atorvastatin (LIPITOR)  40 MG tablet      cyclobenzaprine (FLEXERIL) 10 MG tablet Take 10 mg by mouth 3 (three) times daily as needed.     gabapentin (NEURONTIN) 300 MG capsule TAKE 1 CAPSULE BY MOUTH EVERYDAY AT BEDTIME     lisinopril (ZESTRIL) 40 MG tablet      meloxicam (MOBIC) 15 MG tablet meloxicam 15 mg tablet  TAKE 1 TABLET BY MOUTH EVERY DAY     metFORMIN (GLUCOPHAGE) 500 MG tablet metformin 500 mg tablet  Take 1 tablet twice a day by oral route for 90 days.     montelukast (SINGULAIR) 10 MG tablet Take  1 tablet by mouth daily.     SYMBICORT 160-4.5 MCG/ACT inhaler Inhale into the lungs.     albuterol (PROVENTIL) (2.5 MG/3ML) 0.083% nebulizer solution Take 3 mLs (2.5 mg total) by nebulization every 6 (six) hours as needed for wheezing or shortness of breath. 75 mL 12   albuterol (VENTOLIN HFA) 108 (90 Base) MCG/ACT inhaler Proventil HFA 90 mcg/actuation aerosol inhaler  Inhale 2 puffs every 4 hours by inhalation route as needed. (Patient not taking: Reported on 07/27/2022)     ALPRAZolam (XANAX) 0.5 MG tablet take 1 po tid prn severe panic anxiety emotional spells     Budeson-Glycopyrrol-Formoterol (BREZTRI AEROSPHERE) 160-9-4.8 MCG/ACT AERO Inhale 2 puffs into the lungs in the morning and at bedtime. (Patient not taking: Reported on 11/13/2022) 1 each 11   Budeson-Glycopyrrol-Formoterol (BREZTRI AEROSPHERE) 160-9-4.8 MCG/ACT AERO Inhale 2 puffs into the lungs in the morning and at bedtime. (Patient not taking: Reported on 11/13/2022) 2 each 0   fluticasone-salmeterol (ADVAIR HFA) 115-21 MCG/ACT inhaler  (Patient not taking: Reported on 11/13/2022)     ipratropium-albuterol (DUONEB) 0.5-2.5 (3) MG/3ML SOLN Take 3 mLs by nebulization 4 (four) times daily.     Current Facility-Administered Medications  Medication Dose Route Frequency Provider Last Rate Last Admin   0.9 %  sodium chloride infusion  500 mL Intravenous Once Valissa Lyvers, Carlota Raspberry, MD        Allergies as of 11/13/2022   (No Known Allergies)    Family History  Problem Relation Age of Onset   Heart disease Mother    Lung cancer Mother    Asthma Neg Hx    Colitis Neg Hx    Colon cancer Neg Hx    Colon polyps Neg Hx    Esophageal cancer Neg Hx    Stomach cancer Neg Hx    Rectal cancer Neg Hx     Social History   Socioeconomic History   Marital status: Divorced    Spouse name: Not on file   Number of children: 3   Years of education: Not on file   Highest education level: Not on file  Occupational History   Not on file   Tobacco Use   Smoking status: Former    Packs/day: 3.00    Years: 15.00    Total pack years: 45.00    Types: Cigarettes    Quit date: 1989    Years since quitting: 35.1   Smokeless tobacco: Never  Vaping Use   Vaping Use: Never used  Substance and Sexual Activity   Alcohol use: Yes    Comment: occ   Drug use: Yes    Types: Marijuana    Comment: CBD gummies   Sexual activity: Not on file  Other Topics Concern   Not on file  Social History Narrative   Not on file   Social Determinants of Health  Financial Resource Strain: Not on file  Food Insecurity: Not on file  Transportation Needs: Not on file  Physical Activity: Not on file  Stress: Not on file  Social Connections: Not on file  Intimate Partner Violence: Not on file    Review of Systems: All other review of systems negative except as mentioned in the HPI.  Physical Exam: Vital signs BP (!) 149/67   Pulse 85   Temp 98.5 F (36.9 C) (Skin)   Ht '5\' 6"'$  (1.676 m)   Wt 150 lb (68 kg)   SpO2 96%   BMI 24.21 kg/m   General:   Alert,  Well-developed, pleasant and cooperative in NAD Lungs:  Clear throughout to auscultation.   Heart:  Regular rate and rhythm Abdomen:  Soft, nontender and nondistended.   Neuro/Psych:  Alert and cooperative. Normal mood and affect. A and O x 3  Jolly Mango, MD [facility]

## 2022-11-13 NOTE — Telephone Encounter (Signed)
Returned pt's call and explained that this is an expected side effect of the bowel prep and that he still needs to take the second dose this morning. Instructed pt on measures he can take to protect his clothes if he is still going to the bathroom when it is time for him to arrive for his appointment. Pt verbalized understanding.

## 2022-11-13 NOTE — Progress Notes (Signed)
Called to room to assist during endoscopic procedure.  Patient ID and intended procedure confirmed with present staff. Received instructions for my participation in the procedure from the performing physician.  

## 2022-11-13 NOTE — Patient Instructions (Signed)
Handout on polyps and diverticulosis given. Resume previous diet and continue present medications.     YOU HAD AN ENDOSCOPIC PROCEDURE TODAY AT THE [facility]:   Refer to the procedure report that was given to you for any specific questions about what was found during the examination.  If the procedure report does not answer your questions, please call your gastroenterologist to clarify.  If you requested that your care partner not be given the details of your procedure findings, then the procedure report has been included in a sealed envelope for you to review at your convenience later.  YOU SHOULD EXPECT: Some feelings of bloating in the abdomen. Passage of more gas than usual.  Walking can help get rid of the air that was put into your GI tract during the procedure and reduce the bloating. If you had a lower endoscopy (such as a colonoscopy or flexible sigmoidoscopy) you may notice spotting of blood in your stool or on the toilet paper. If you underwent a bowel prep for your procedure, you may not have a normal bowel movement for a few days.  Please Note:  You might notice some irritation and congestion in your nose or some drainage.  This is from the oxygen used during your procedure.  There is no need for concern and it should clear up in a day or so.  SYMPTOMS TO REPORT IMMEDIATELY:  Following lower endoscopy (colonoscopy or flexible sigmoidoscopy):  Excessive amounts of blood in the stool  Significant tenderness or worsening of abdominal pains  Swelling of the abdomen that is new, acute  Fever of 100F or higher   For urgent or emergent issues, a gastroenterologist can be reached at any hour by calling [phone]. Do not use MyChart messaging for urgent concerns.    DIET:  We do recommend a small meal at first, but then you may proceed to your regular diet.  Drink plenty of fluids but you should avoid alcoholic beverages for 24 hours.  ACTIVITY:  You should plan to  take it easy for the rest of today and you should NOT DRIVE or use heavy machinery until tomorrow (because of the sedation medicines used during the test).    FOLLOW UP: Our staff will call the number listed on your records the next business day following your procedure.  We will call around 7:15- 8:00 am to check on you and address any questions or concerns that you may have regarding the information given to you following your procedure. If we do not reach you, we will leave a message.     If any biopsies were taken you will be contacted by phone or by letter within the next 1-3 weeks.  Please call us at [phone] if you have not heard about the biopsies in 3 weeks.    SIGNATURES/CONFIDENTIALITY: You and/or your care partner have signed paperwork which will be entered into your electronic medical record.  These signatures attest to the fact that that the information above on your After Visit Summary has been reviewed and is understood.  Full responsibility of the confidentiality of this discharge information lies with you and/or your care-partner. 

## 2022-11-13 NOTE — Telephone Encounter (Signed)
PT is scheduled for a colonoscopy this afternoon and says that the SuPrep still has him going. He's been up all night in the bathroom and is concerned after only drinking one bottle. Please advise

## 2022-11-14 ENCOUNTER — Telehealth: Payer: Self-pay

## 2022-11-14 NOTE — Telephone Encounter (Signed)
Left message on answering machine. 

## 2022-11-21 ENCOUNTER — Encounter: Payer: Self-pay | Admitting: Gastroenterology

## 2023-08-14 ENCOUNTER — Ambulatory Visit: Payer: Medicare HMO | Admitting: Internal Medicine

## 2023-10-29 ENCOUNTER — Ambulatory Visit: Payer: Medicare HMO | Admitting: Internal Medicine

## 2024-09-12 ENCOUNTER — Other Ambulatory Visit: Payer: Self-pay | Admitting: Physician Assistant

## 2024-09-12 DIAGNOSIS — E7849 Other hyperlipidemia: Secondary | ICD-10-CM

## 2024-09-12 DIAGNOSIS — E11A Type 2 diabetes mellitus without complications in remission: Secondary | ICD-10-CM

## 2024-09-12 DIAGNOSIS — I10 Essential (primary) hypertension: Secondary | ICD-10-CM

## 2024-09-23 ENCOUNTER — Inpatient Hospital Stay
Admission: RE | Admit: 2024-09-23 | Discharge: 2024-09-23 | Attending: Physician Assistant | Admitting: Physician Assistant

## 2024-09-23 DIAGNOSIS — E7849 Other hyperlipidemia: Secondary | ICD-10-CM

## 2024-09-23 DIAGNOSIS — I10 Essential (primary) hypertension: Secondary | ICD-10-CM

## 2024-09-23 DIAGNOSIS — E11A Type 2 diabetes mellitus without complications in remission: Secondary | ICD-10-CM
